# Patient Record
Sex: Male | Born: 1960 | Hispanic: No | Marital: Married | Smoking: Never smoker
Health system: Southern US, Community
[De-identification: ages and names within clinical notes are randomized; demographics above are authoritative.]

## PROBLEM LIST (undated history)

## (undated) DIAGNOSIS — E119 Type 2 diabetes mellitus without complications: Secondary | ICD-10-CM

## (undated) DIAGNOSIS — I10 Essential (primary) hypertension: Secondary | ICD-10-CM

## (undated) DIAGNOSIS — E785 Hyperlipidemia, unspecified: Secondary | ICD-10-CM

## (undated) DIAGNOSIS — N189 Chronic kidney disease, unspecified: Secondary | ICD-10-CM

## (undated) DIAGNOSIS — I639 Cerebral infarction, unspecified: Secondary | ICD-10-CM

## (undated) HISTORY — PX: AV FISTULA PLACEMENT: SHX1204

## (undated) HISTORY — PX: CHOLECYSTECTOMY: SHX55

---

## 1999-03-08 ENCOUNTER — Encounter: Admission: RE | Admit: 1999-03-08 | Discharge: 1999-04-05 | Payer: Self-pay | Admitting: Family Medicine

## 2000-12-21 ENCOUNTER — Encounter: Payer: Self-pay | Admitting: Vascular Surgery

## 2000-12-21 ENCOUNTER — Ambulatory Visit (HOSPITAL_COMMUNITY): Admission: RE | Admit: 2000-12-21 | Discharge: 2000-12-21 | Payer: Self-pay | Admitting: Vascular Surgery

## 2001-12-12 ENCOUNTER — Ambulatory Visit (HOSPITAL_COMMUNITY): Admission: RE | Admit: 2001-12-12 | Discharge: 2001-12-12 | Payer: Self-pay | Admitting: Vascular Surgery

## 2001-12-12 ENCOUNTER — Encounter: Payer: Self-pay | Admitting: Vascular Surgery

## 2001-12-20 ENCOUNTER — Encounter: Payer: Self-pay | Admitting: Vascular Surgery

## 2001-12-20 ENCOUNTER — Ambulatory Visit (HOSPITAL_COMMUNITY): Admission: RE | Admit: 2001-12-20 | Discharge: 2001-12-20 | Payer: Self-pay | Admitting: Vascular Surgery

## 2002-04-10 ENCOUNTER — Ambulatory Visit (HOSPITAL_COMMUNITY): Admission: RE | Admit: 2002-04-10 | Discharge: 2002-04-10 | Payer: Self-pay | Admitting: Nephrology

## 2002-04-10 ENCOUNTER — Encounter: Payer: Self-pay | Admitting: Nephrology

## 2002-05-29 ENCOUNTER — Ambulatory Visit (HOSPITAL_COMMUNITY): Admission: RE | Admit: 2002-05-29 | Discharge: 2002-05-29 | Payer: Self-pay | Admitting: Vascular Surgery

## 2002-05-29 ENCOUNTER — Encounter: Payer: Self-pay | Admitting: Vascular Surgery

## 2007-01-24 ENCOUNTER — Ambulatory Visit (HOSPITAL_COMMUNITY): Admission: RE | Admit: 2007-01-24 | Discharge: 2007-01-24 | Payer: Self-pay | Admitting: Nephrology

## 2007-04-23 ENCOUNTER — Ambulatory Visit (HOSPITAL_COMMUNITY): Admission: RE | Admit: 2007-04-23 | Discharge: 2007-04-23 | Payer: Self-pay | Admitting: Nephrology

## 2007-06-18 ENCOUNTER — Ambulatory Visit (HOSPITAL_COMMUNITY): Admission: RE | Admit: 2007-06-18 | Discharge: 2007-06-18 | Payer: Self-pay | Admitting: Nephrology

## 2007-07-04 ENCOUNTER — Encounter: Payer: Self-pay | Admitting: Infectious Diseases

## 2007-07-08 ENCOUNTER — Encounter: Payer: Self-pay | Admitting: Infectious Diseases

## 2007-10-01 ENCOUNTER — Ambulatory Visit: Payer: Self-pay | Admitting: Infectious Diseases

## 2007-10-01 DIAGNOSIS — N186 End stage renal disease: Secondary | ICD-10-CM | POA: Insufficient documentation

## 2007-10-01 DIAGNOSIS — E119 Type 2 diabetes mellitus without complications: Secondary | ICD-10-CM

## 2007-10-01 DIAGNOSIS — I10 Essential (primary) hypertension: Secondary | ICD-10-CM | POA: Insufficient documentation

## 2007-10-01 LAB — CONVERTED CEMR LAB
AST: 17 units/L (ref 0–37)
Albumin: 4.2 g/dL (ref 3.5–5.2)
BUN: 63 mg/dL — ABNORMAL HIGH (ref 6–23)
Glucose, Bld: 146 mg/dL — ABNORMAL HIGH (ref 70–99)
Hemoglobin: 12 g/dL — ABNORMAL LOW (ref 13.0–17.0)
Potassium: 5.9 meq/L — ABNORMAL HIGH (ref 3.5–5.3)
RBC: 5.51 M/uL (ref 4.22–5.81)
RDW: 17.6 % — ABNORMAL HIGH (ref 11.5–15.5)
Total Bilirubin: 0.6 mg/dL (ref 0.3–1.2)
Total Protein: 7.3 g/dL (ref 6.0–8.3)
WBC: 6.7 10*3/uL (ref 4.0–10.5)

## 2009-06-02 IMAGING — XA IR VENTRICULOPERITONEALSHUNT EVAL/INJ
1 series · 13 of 24 positions shown · non-contrast
Comparison: none

CLINICAL DATA: Poor access flow.
 LEFT ARM AV FISTULOGRAM AND VENOUS ANGIOPLASTY ? 06/18/07 ? 4800 HOURS:
 AV Shuntogram:
 Procedure:  The left arm was prepped and draped in sterile fashion.  An 18-gauge Angiocath was inserted into the outflow vein.  Contrast was injected without complication.

[Series 1: run · 13 of 29 slices shown]
[im 1/29]
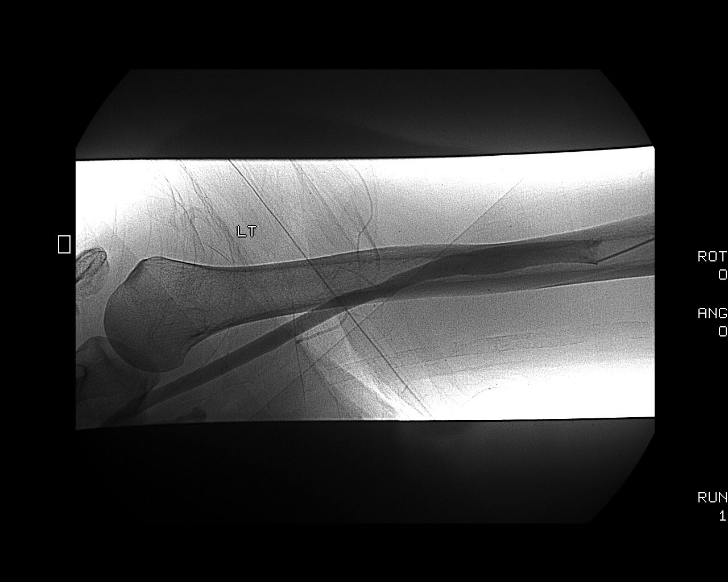
[im 3/29]
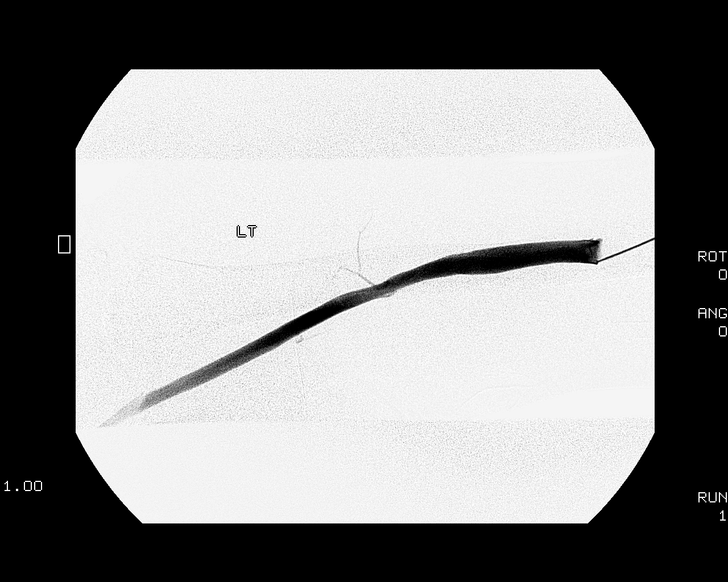
[im 5/29]
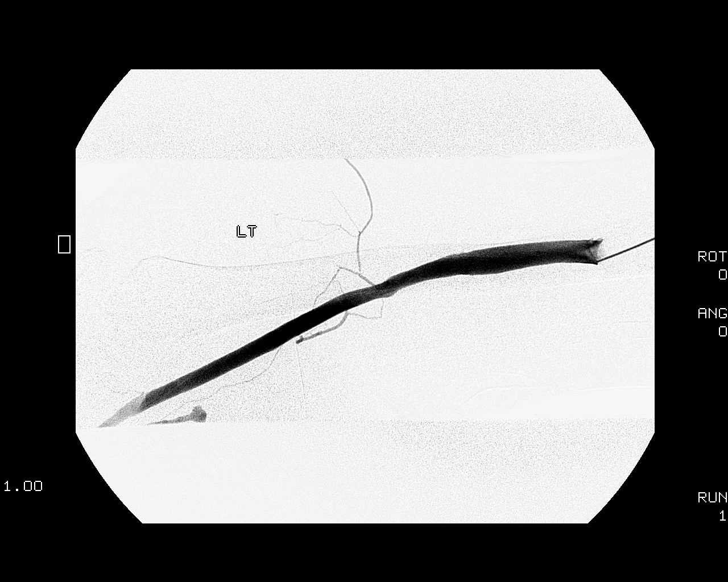
[im 8/29]
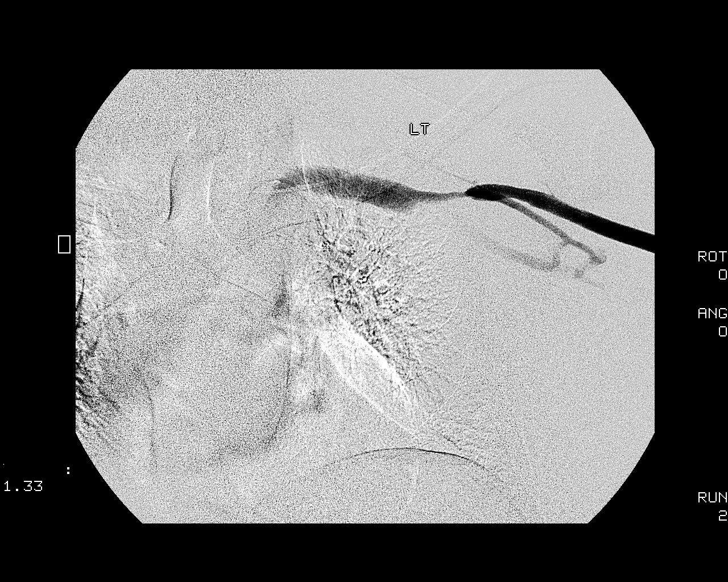
[im 10/29]
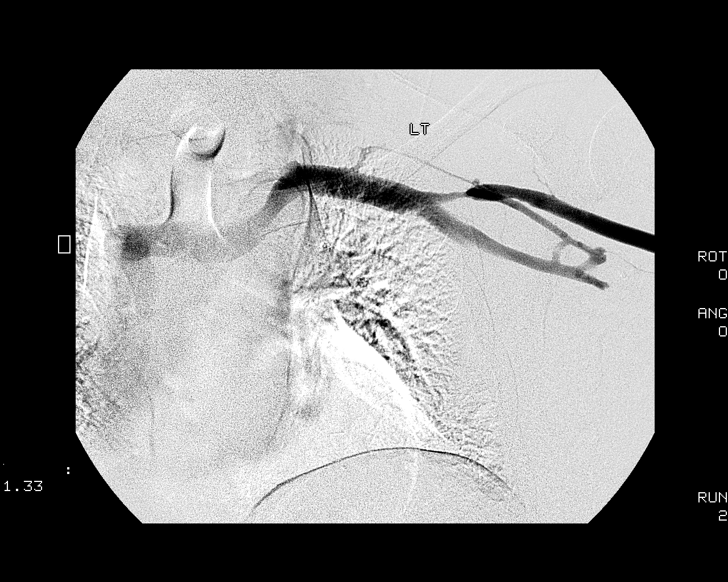
[im 13/29]
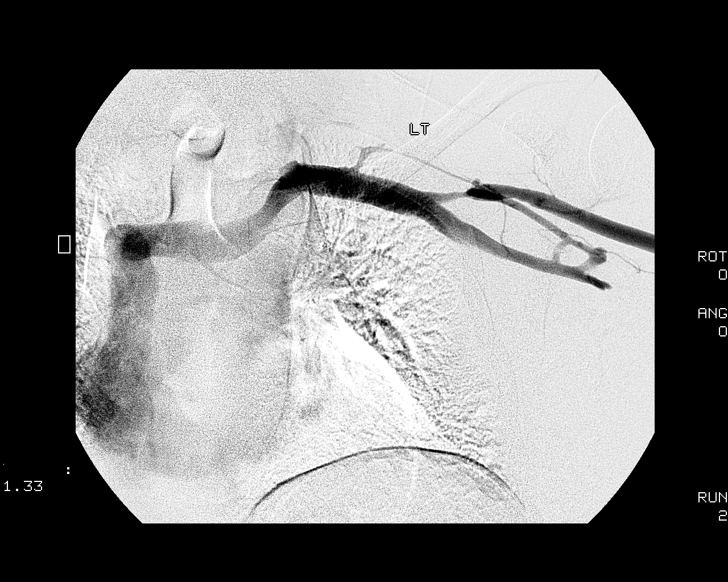
[im 15/29]
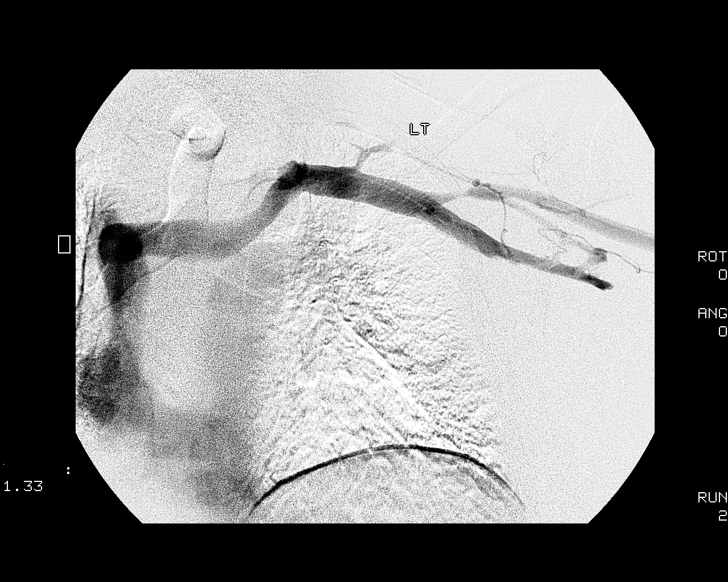
[im 16/29]
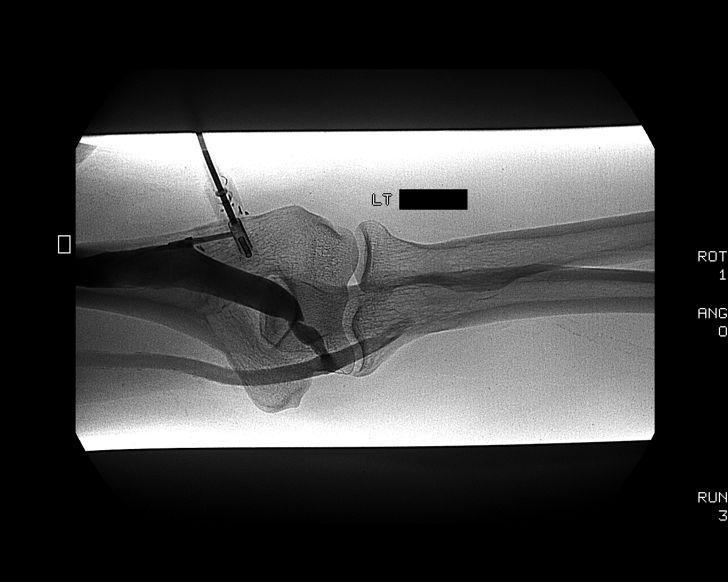
[im 19/29]
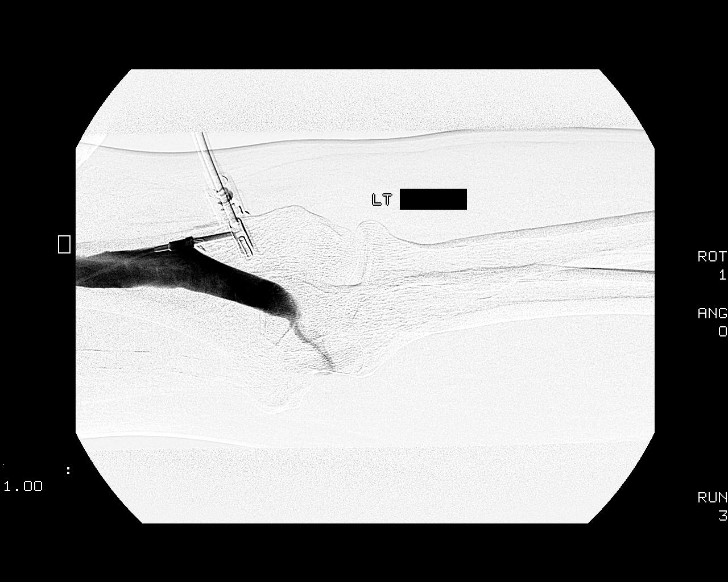
[im 21/29]
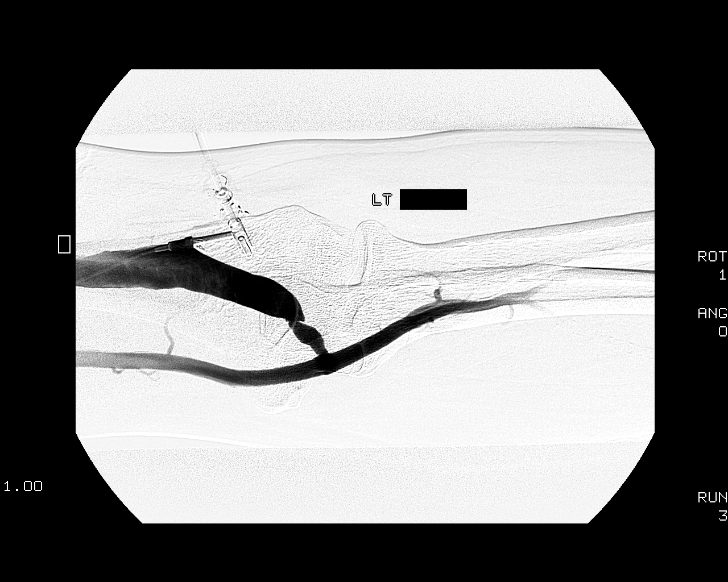
[im 24/29]
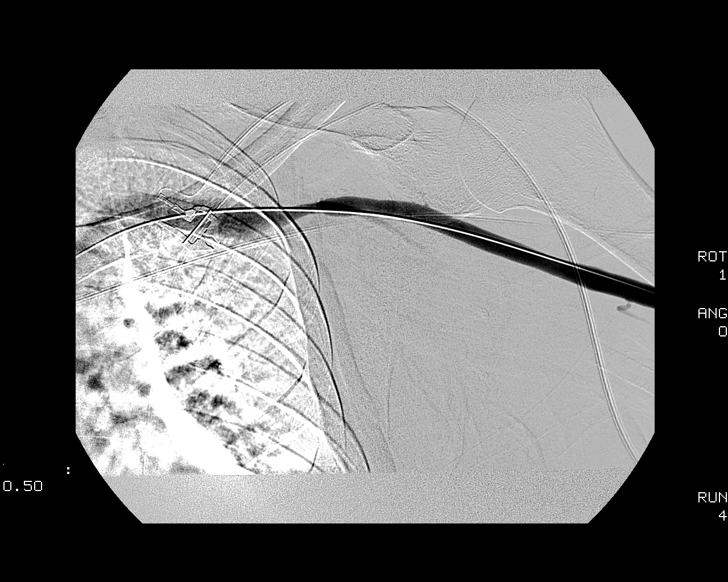
[im 26/29]
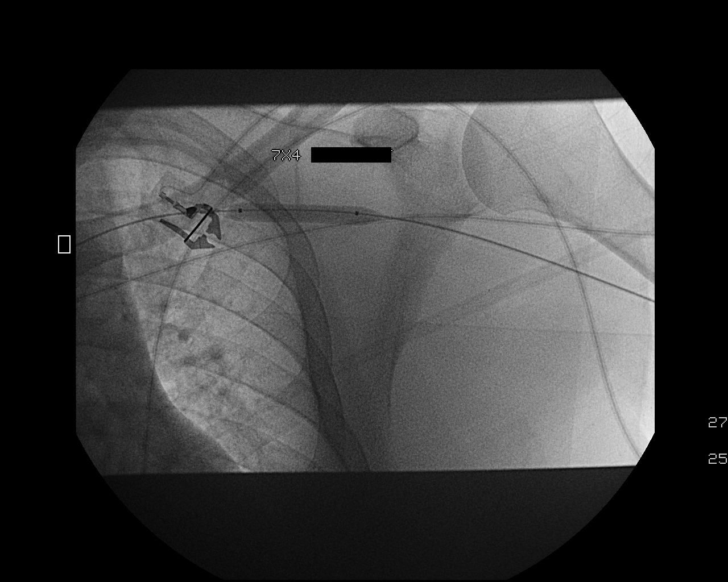
[im 29/29]
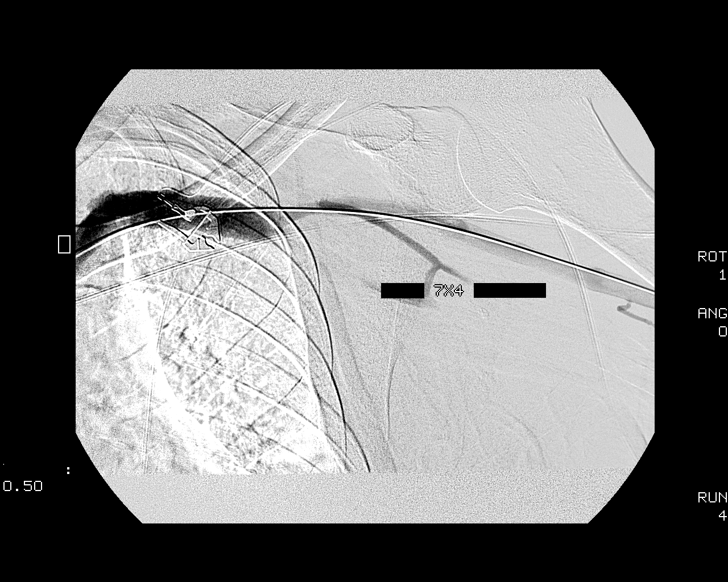

[13 of 24 positions shown; findings below may reference images not displayed]

FINDINGS: There is some irregularity of the arteriovenous anastomosis but no significant stenosis.  There is also a significant stenosis at the cephalosubclavian junction.  Central venous structures are patent.
IMPRESSION: Cephalosubclavian junction stenosis.
 Venous Angioplasty:  
 Procedure:  the Angiocath was exchanged over Danii Aujla for a 6 French sheath.  The cephalosubclavian junction was dilated to 7 mm.  Repeat imaging demonstrates patency.  The sheath was removed and hemostasis was achieved with an 0 Prolene figure 8 stitch.  No complications.
FINDINGS: Post angioplasty imaging demonstrates patency of the cephalosubclavian junction.
IMPRESSION: Successful dilatation of the cephalosubclavian junction to 7 mm.

## 2009-06-29 ENCOUNTER — Ambulatory Visit (HOSPITAL_COMMUNITY): Admission: RE | Admit: 2009-06-29 | Discharge: 2009-06-29 | Payer: Self-pay | Admitting: Nephrology

## 2009-11-08 ENCOUNTER — Ambulatory Visit (HOSPITAL_COMMUNITY): Admission: RE | Admit: 2009-11-08 | Discharge: 2009-11-08 | Payer: Self-pay | Admitting: Nephrology

## 2009-12-30 ENCOUNTER — Ambulatory Visit (HOSPITAL_COMMUNITY): Admission: RE | Admit: 2009-12-30 | Discharge: 2009-12-30 | Payer: Self-pay | Admitting: Nephrology

## 2010-01-13 ENCOUNTER — Ambulatory Visit (HOSPITAL_COMMUNITY): Admission: RE | Admit: 2010-01-13 | Discharge: 2010-01-13 | Payer: Self-pay | Admitting: Nephrology

## 2010-01-21 ENCOUNTER — Ambulatory Visit: Payer: Self-pay | Admitting: Vascular Surgery

## 2010-01-26 ENCOUNTER — Ambulatory Visit (HOSPITAL_COMMUNITY): Admission: RE | Admit: 2010-01-26 | Discharge: 2010-01-26 | Payer: Self-pay | Admitting: Vascular Surgery

## 2010-01-26 ENCOUNTER — Ambulatory Visit: Payer: Self-pay | Admitting: Vascular Surgery

## 2010-05-17 ENCOUNTER — Ambulatory Visit (HOSPITAL_COMMUNITY)
Admission: RE | Admit: 2010-05-17 | Discharge: 2010-05-17 | Payer: Self-pay | Source: Home / Self Care | Attending: Surgery | Admitting: Surgery

## 2010-08-01 LAB — POCT I-STAT, CHEM 8
Calcium, Ion: 1.22 mmol/L (ref 1.12–1.32)
Glucose, Bld: 176 mg/dL — ABNORMAL HIGH (ref 70–99)
HCT: 34 % — ABNORMAL LOW (ref 39.0–52.0)
TCO2: 27 mmol/L (ref 0–100)

## 2010-08-04 LAB — GLUCOSE, CAPILLARY: Glucose-Capillary: 118 mg/dL — ABNORMAL HIGH (ref 70–99)

## 2010-08-04 LAB — POCT I-STAT 4, (NA,K, GLUC, HGB,HCT)
HCT: 41 % (ref 39.0–52.0)
Hemoglobin: 13.9 g/dL (ref 13.0–17.0)

## 2010-08-05 ENCOUNTER — Other Ambulatory Visit (HOSPITAL_COMMUNITY): Payer: Self-pay | Admitting: Nephrology

## 2010-08-05 DIAGNOSIS — N186 End stage renal disease: Secondary | ICD-10-CM

## 2010-08-09 ENCOUNTER — Ambulatory Visit (HOSPITAL_COMMUNITY)
Admission: RE | Admit: 2010-08-09 | Discharge: 2010-08-09 | Disposition: A | Discharge status: Home / Self Care | Payer: Self-pay | Source: Ambulatory Visit | Attending: Nephrology | Admitting: Nephrology

## 2010-08-09 ENCOUNTER — Encounter (HOSPITAL_COMMUNITY): Payer: Self-pay

## 2010-08-09 DIAGNOSIS — N186 End stage renal disease: Secondary | ICD-10-CM | POA: Insufficient documentation

## 2010-08-09 DIAGNOSIS — Z794 Long term (current) use of insulin: Secondary | ICD-10-CM | POA: Insufficient documentation

## 2010-09-06 ENCOUNTER — Ambulatory Visit (HOSPITAL_COMMUNITY): Admission: RE | Admit: 2010-09-06 | Payer: Self-pay | Source: Ambulatory Visit | Admitting: Surgery

## 2010-10-04 NOTE — Assessment & Plan Note (Signed)
OFFICE VISIT   CINCERE, ZORN  DOB:  [birth year]                                       01/21/2010  ZOXWR#:[id number]   This is a new vascular patient consultation.   CHIEF COMPLAINT:  Failing left arterial venous fistula.   HISTORY OF PRESENT ILLNESS:  This is a 50 year old gentleman who  previously has had a previous left arteriovenous graft in the forearm  position and a left brachiocephalic arteriovenous fistula (2004)which  has been functional until recently.  He required a fistulogram that was  performed by the interventionalist on January 13, 2010.  This  demonstrated adherent clot in the humerus region and was felt that  further endovascular intervention on this fistula was going to be  unsuccessful.  The patient was referred back to [place] for evaluation for  possible graft placement.  At this point he notes no steal symptoms in  the left hand.  No motor deficits.  No sensory deficits.  Never  developed any pseudoaneurysmal changes in his arteriovenous fistula and  no bleeding complications from that.  He is right-hand dominant and  previously has had a right internal jugular tunneled dialysis catheter  placed.   PAST MEDICAL HISTORY:  #1.  End-stage renal disease requiring  hemodialysis.  #2. Secondary hyperparathyroidism.  #3. Hepatitis C.   PAST SURGICAL HISTORY:  #1.  Previous left forearm arteriovenous graft.  #2. Previous left brachiocephalic arteriovenous fistula with placement  of a right internal jugular tunneled dialysis catheter.   SOCIAL HISTORY:  He is married.  Currently intermittently employed.  He  does not currently smoke, but has a previous smoking history, but even  then minimal cigarettes per day.  He drinks socially, but the denies any  illicit drug use.   FAMILY HISTORY:  Both mother and father had diabetes and hypertension.   REVIEW OF SYSTEMS:  He notes a change in his eyesight, but otherwise the  rest of his 12-point  review of systems as documented in his chart is  noted to be negative.   PHYSICAL EXAM:  VITAL SIGNS:  Temperature was 97.8, blood pressure  170/90, heart rate of 69, respirations of 12.  GENERAL:  Well developed, well nourished in no apparent distress.  HEAD:  Normocephalic, atraumatic.  ENT:  Hearing is grossly intact.  There is no obvious nasal drainage.  The oropharynx had no erythema or exudate.  NECK:  Supple without nuchal rigidity.  EYES:  Pupils equal, round, react to light.  Extraocular movements were  intact.  PULMONARY:  Symmetric expansion.  Good air movement.  Clear to  auscultation bilaterally.  No rhonchi, rales or wheezes were noted.  CARDIAC:  Regular rate and rhythm.  Normal S1-S2.  There were no  murmurs, rubs, thrills or gallops.  On his pulse examination; bilateral  radial pulses were easily palpable as were the brachial.  There is no  pulse throughout the forearm graft I did appreciate a pulsatile  brachiocephalic arteriovenous fistula with a high pitch bruit.  His  aorta was not palpable.  Bilateral femoral, popliteal and pedal pulses  were palpable.  GI:  Soft, somewhat obese, nontender, nondistended, no guarding or  rebound.  No masses or hepatosplenomegaly.  I could not feel this  patient's aorta due to his obesity.  No costovertebral angle tenderness.  MUSCULOSKELETAL:  He had intact strength in  bilateral upper and lower  extremities with 5/5 hand grip in both hands.  Intrinsic and extrinsic  muscles in the hands were intact.  SKIN:  On his left arm he had multiple healed incisions consistent with  the previously noted procedures.  There is no ecchymoses or any  pseudoaneurysmal changes in the left brachiocephalic fistula.  Neither  lower extremities demonstrated any gangrenous changes or any ulcerations  or erythema.  NEUROLOGY:  Cranial nerves II-XII were intact.  MUSCULOSKELETAL:  As noted above.  SENSORY:  Bilateral upper and lower extremities were  intact grossly.  Specifically pinpoint and light touch were present in both hands.  PSYCH:  Judgment was intact.  Mood and affect were appropriate for his  situation.  LYMPHATIC:  There is no cervical, inguinal or axillary lymphadenopathy.   The patient had bilateral upper extremity vein mapping which I  interpreted.  On the right side he has a cephalic vein that ranges from  0.42 up to 0.54 from the upper portion of the arm down to the wrist, at  least four large side branches noted.  The basilic vein on the right  side is noted in the range between 0.28-0.52 cm.  On the left side the  cephalic was noted to be in use with an arteriovenous fistula.  The left  basilic ranged between a 0.4 to 0.8 cm with a median antecubital noted  to be 0.64.   MEDICAL DECISION MAKING:  This is a 50 year old gentleman who now has a  failing left brachiocephalic arteriovenous fistula as documented by his  fistulogram.  It appears that he will occlude the outflow portion of the  cephalic vein in the imminent future.  This is further emphasized by the  fact that he is only achieving flow rates of 300 on dialysis.  Due to  the location of this stenotic segment I think thatan endovascular  intervention would probably require stent placement.  However, the  basilic vein at the segment appears widely patent as it joins into the  axillary, so placement of a stent at this location may compromise a left  sided basilic vein transposition.  So I do not recommend this at this  time.  He has an excellent right radiocephalic fistula potential.  I  gave him his options including the Bescia-Cimino fistula, a Kaufman  fistula on the right side and the possibility of right or left basilic  vein transposition.  I explained to him the various steal possibilities  and he elected to proceed forward with the right radiocephalic  arteriovenous fistula which tentatively I will schedule for this coming  Wednesday.  At that time  due to the imminent failure of his left  brachiocephalic fistula I would go ahead and place a left internal  jugular tunneled dialysis catheter to allow continued dialysis while he  matures this fistula.     Leonides Sake, MD  Electronically Signed   BC/MEDQ  D:  01/21/2010  T:  01/25/2010  Job:  [id number]

## 2010-10-04 NOTE — Procedures (Signed)
CEPHALIC VEIN MAPPING   INDICATION:  Preop for AV fistula placement.   HISTORY:  End-stage renal disease, current left brachiocephalic AV fistula.   EXAM:  The right cephalic vein is compressible with diameter measurements  ranging from 0.42 to 0.54 cm.   The right basilic vein is compressible with diameter measurements  ranging from 0.28 to 0.54 cm.   The left cephalic vein is currently in use for a patent AV fistula.   The left basilic vein is compressible with diameter measurements ranging  from 0.4 to 0.8 cm.   See attached worksheet for all measurements.   IMPRESSION:  1. Patent right cephalic and bilateral basilic veins with diameter      measurements as described above.  2. Patent left brachiocephalic arteriovenous fistula noted.   ___________________________________________  Leonides Sake, MD   CH/MEDQ  D:  01/21/2010  T:  01/21/2010  Job:  [id number]

## 2010-10-07 NOTE — Op Note (Signed)
Theodore Hurley, Theodore Hurley                          ACCOUNT NO.:  [id number]   MEDICAL RECORD NO.:  [id number]                   PATIENT TYPE:  OIB   LOCATION:  2853                                 FACILITY:  [facility]   PHYSICIAN:  Di Kindle. Edilia Bo, M.D.        DATE OF BIRTH:  Jul 04, 1960   DATE OF PROCEDURE:  05/29/2002  DATE OF DISCHARGE:                                 OPERATIVE REPORT   PREOPERATIVE DIAGNOSIS:  Chronic renal failure.   POSTOPERATIVE DIAGNOSIS:  Chronic renal failure.   OPERATION:  1. Exploration of left forearm AV fistula.  2. Placement of new left upper AV fistula.  3. Ultrasound of both internal jugular veins.  4. Right IJ Diatek catheter (28 cm).   SURGEON:  Di Kindle. Edilia Bo, M.D.   ASSISTANT:  Eber Hong, P.A.   ANESTHESIA:  Local with sedation.   DESCRIPTION OF PROCEDURE:  The patient was taken to the operating room and  sedated by anesthesia.  The entire left upper extremity was prepped and  draped in the usual sterile fashion.  After the skin was infiltrated with 1%  Lidocaine an oblique was made over the cephalic vein above the previous  revision and through this incision, the vein was dissected free and was a  good size vein.  I dissected free the radial artery proximally to the  previous revision; however, the artery here was very small and I did not  think this would support a fistula or improve the function of this fistula,  therefore, I could not revise the current fistula.  It was then ligated with  2-0 silk tie and the wound was closed with interrupted 3-0 Vicryl and the  skin closed with 4-0 Vicryl.   Next, a separate transverse incision was made above the antecubital space,  where here the upper arm cephalic vein was dissected free and was good size.  Beneath the fascia, the brachial artery was dissected free.  The patient was  then heparinized.  The brachial artery was clamped proximally and distally  and a longitudinal  arteriotomy was made.  The vein was mobilized over after  it was ligated distally and it was sewn end-to-side to the artery using  continuous 6-0 Prolene suture.  At the completion, there was a good thrill  in the fistula.  Hemostasis was obtained in the wound.  The wound was closed  with a deep layer of 3-0 Vicryl.  The skin was closed with 4-0 Vicryl.  Of  note, the heparin had been reversed with Protamine.  Sterile dressing was  applied.   Next, the patient was repositioned and both internal jugular veins were  identified with the ultrasound scanner and marked.  Next, the neck and upper  chest were prepped and draped in the usual sterile fashion. After the skin  was infiltrated with 1% Lidocaine, the right internal jugular vein was  cannulated and a guide wire introduced into the superior  vena cava under  fluoroscopic control.  The track over the wire was then dilated and then the  dilator and peel away sheath were passed over the wire and the wire and  dilator removed.  The catheter was positioned in the right atrium.  The exit  site for the catheter was selected and the skin anesthetized between the two  areas.  The catheter was then brought through the tunnel and secured with a  3-0 Nylon distally.  Next, the IJ cannulation site was closed with  a 4-0 subcuticular stitch.  Both ports withdrew easily and were then flushed  with heparinized saline and filled with concentrated heparin.  Sterile  dressing was applied.  The patient tolerated the procedure well and was  transferred to the recovery room in satisfactory condition.  All needle and  sponge counts were correct.                                               Di Kindle. Edilia Bo, M.D.    CSD/MEDQ  D:  05/29/2002  T:  05/29/2002  Job:  [id number]

## 2010-10-07 NOTE — Op Note (Signed)
   [name], Theodore Hurley                          ACCOUNT NO.:  [id number]   MEDICAL RECORD NO.:  [id number]                   PATIENT TYPE:  OIB   LOCATION:  2899                                 FACILITY:  [facility]   PHYSICIAN:  Quita Skye. Hart Rochester, M.D.               DATE OF BIRTH:  [birth year]   DATE OF PROCEDURE:  12/20/2001  DATE OF DISCHARGE:  12/20/2001                                 OPERATIVE REPORT   PREOPERATIVE DIAGNOSIS:  Poorly functioning AV fistula, left forearm.   POSTOPERATIVE DIAGNOSIS:  Poorly functioning AV fistula, left forearm  secondary to focal stenosis and arterial anastomosis.   OPERATION:  Dacron patch angioplasty of stenotic segment of intimal  hyperplasia in AV fistula, left forearm.   SURGEON:  Quita Skye. Hart Rochester, M.D.   FIRST ASSISTANT:  Loura Pardon.   ANESTHESIA:  Local.   PROCEDURE:  The patient was taken to the operating room and placed in the  supine position.  The left upper extremity was prepped with Betadine and  scrubbing solution and draped in routine sterile manner.  After infiltration  with 1% Xylocaine with Epinephrine, a longitudinal incision was made through  the previous scar at the wrist where the cephalic vein had been anastomosed  to the radial artery.  There was an excellent pulse over the hood of the  anastomosis and then a strictured area about 4 cm in length.  The vein  proximal to this had diminished pulse.  3,000 units of Heparin were given  intravenously.  A longitudinal opening was made through the area of  narrowing onto the hood of the anastomosis and also proximally where it  would accept a 4.5 mm dilator.  There was intimal hyperplasia in this  segment and it was removed locally as much as possible with the elevator.  Dacron patch was then sewn into place with 6-0 Prolene.  The vessel loops  were released and there was an improved pulse and thrill in the fistula.  No  Protamine was given.  The wound was irrigated with saline,  closed with  Vicryl in subcuticular fashion, and sterile dressing applied.  The patient  was taken to the recovery room in satisfactory condition.                                                Quita Skye Hart Rochester, M.D.    JDL/MEDQ  D:  12/20/2001  T:  12/25/2001  Job:  [id number]

## 2010-10-28 ENCOUNTER — Other Ambulatory Visit (HOSPITAL_COMMUNITY): Payer: Self-pay | Admitting: Nephrology

## 2010-10-28 DIAGNOSIS — N186 End stage renal disease: Secondary | ICD-10-CM

## 2010-11-01 ENCOUNTER — Ambulatory Visit (HOSPITAL_COMMUNITY): Admission: RE | Admit: 2010-11-01 | Payer: Self-pay | Source: Ambulatory Visit | Admitting: Nephrology

## 2010-11-03 ENCOUNTER — Other Ambulatory Visit (HOSPITAL_COMMUNITY): Payer: Self-pay | Admitting: Nephrology

## 2010-11-03 DIAGNOSIS — I878 Other specified disorders of veins: Secondary | ICD-10-CM

## 2010-11-07 ENCOUNTER — Other Ambulatory Visit (HOSPITAL_COMMUNITY): Payer: Self-pay | Admitting: Nephrology

## 2010-11-07 DIAGNOSIS — I878 Other specified disorders of veins: Secondary | ICD-10-CM

## 2010-11-08 ENCOUNTER — Inpatient Hospital Stay (HOSPITAL_COMMUNITY): Admission: RE | Admit: 2010-11-08 | Payer: Self-pay | Source: Ambulatory Visit

## 2010-11-24 ENCOUNTER — Ambulatory Visit (HOSPITAL_COMMUNITY)
Admission: RE | Admit: 2010-11-24 | Discharge: 2010-11-24 | Disposition: A | Discharge status: Home / Self Care | Payer: Self-pay | Source: Ambulatory Visit | Attending: Nephrology | Admitting: Nephrology

## 2010-11-24 DIAGNOSIS — Y832 Surgical operation with anastomosis, bypass or graft as the cause of abnormal reaction of the patient, or of later complication, without mention of misadventure at the time of the procedure: Secondary | ICD-10-CM | POA: Insufficient documentation

## 2010-11-24 DIAGNOSIS — I878 Other specified disorders of veins: Secondary | ICD-10-CM

## 2010-11-24 DIAGNOSIS — N186 End stage renal disease: Secondary | ICD-10-CM | POA: Insufficient documentation

## 2010-11-24 DIAGNOSIS — T82898A Other specified complication of vascular prosthetic devices, implants and grafts, initial encounter: Secondary | ICD-10-CM | POA: Insufficient documentation

## 2010-11-24 MED ORDER — IOHEXOL 300 MG/ML  SOLN
100.0000 mL | Freq: Once | INTRAMUSCULAR | Status: AC | PRN
  Administered 2010-11-24: 40 mL via INTRAVENOUS

## 2010-12-07 ENCOUNTER — Other Ambulatory Visit (HOSPITAL_COMMUNITY): Payer: Self-pay | Admitting: Nephrology

## 2010-12-07 DIAGNOSIS — N186 End stage renal disease: Secondary | ICD-10-CM

## 2010-12-20 ENCOUNTER — Ambulatory Visit (HOSPITAL_COMMUNITY): Payer: Self-pay

## 2011-04-03 ENCOUNTER — Other Ambulatory Visit (HOSPITAL_COMMUNITY): Payer: Self-pay | Admitting: Nephrology

## 2011-04-03 DIAGNOSIS — N186 End stage renal disease: Secondary | ICD-10-CM

## 2011-04-26 ENCOUNTER — Other Ambulatory Visit (HOSPITAL_COMMUNITY): Payer: Self-pay | Admitting: Nephrology

## 2011-04-26 DIAGNOSIS — N186 End stage renal disease: Secondary | ICD-10-CM

## 2011-04-27 ENCOUNTER — Ambulatory Visit (HOSPITAL_COMMUNITY): Admission: RE | Admit: 2011-04-27 | Payer: Self-pay | Source: Ambulatory Visit

## 2011-04-27 ENCOUNTER — Ambulatory Visit (HOSPITAL_COMMUNITY): Payer: Self-pay

## 2011-04-28 ENCOUNTER — Other Ambulatory Visit (HOSPITAL_COMMUNITY): Payer: Self-pay | Admitting: Nephrology

## 2011-04-28 DIAGNOSIS — N186 End stage renal disease: Secondary | ICD-10-CM

## 2011-05-11 ENCOUNTER — Ambulatory Visit (HOSPITAL_COMMUNITY): Admission: RE | Admit: 2011-05-11 | Payer: Self-pay | Source: Ambulatory Visit

## 2011-05-30 ENCOUNTER — Inpatient Hospital Stay (HOSPITAL_COMMUNITY): Admission: RE | Admit: 2011-05-30 | Payer: Self-pay | Source: Ambulatory Visit

## 2011-06-20 ENCOUNTER — Ambulatory Visit (HOSPITAL_COMMUNITY)
Admission: RE | Admit: 2011-06-20 | Discharge: 2011-06-20 | Disposition: A | Discharge status: Home / Self Care | Payer: Self-pay | Source: Ambulatory Visit | Attending: Nephrology | Admitting: Nephrology

## 2011-06-20 DIAGNOSIS — N186 End stage renal disease: Secondary | ICD-10-CM

## 2011-06-20 DIAGNOSIS — Z0389 Encounter for observation for other suspected diseases and conditions ruled out: Secondary | ICD-10-CM | POA: Insufficient documentation

## 2011-06-20 MED ORDER — IOHEXOL 300 MG/ML  SOLN
100.0000 mL | Freq: Once | INTRAMUSCULAR | Status: AC | PRN
  Administered 2011-06-20: 32 mL via INTRAVENOUS

## 2011-06-22 ENCOUNTER — Telehealth (HOSPITAL_COMMUNITY): Payer: Self-pay

## 2011-07-21 ENCOUNTER — Other Ambulatory Visit (HOSPITAL_COMMUNITY): Payer: Self-pay | Admitting: Nephrology

## 2011-07-21 DIAGNOSIS — N186 End stage renal disease: Secondary | ICD-10-CM

## 2011-07-27 ENCOUNTER — Ambulatory Visit (HOSPITAL_COMMUNITY): Payer: Self-pay

## 2011-08-01 ENCOUNTER — Ambulatory Visit (HOSPITAL_COMMUNITY)
Admission: RE | Admit: 2011-08-01 | Discharge: 2011-08-01 | Disposition: A | Discharge status: Home / Self Care | Payer: Self-pay | Source: Ambulatory Visit | Attending: Nephrology | Admitting: Nephrology

## 2011-08-01 DIAGNOSIS — Y832 Surgical operation with anastomosis, bypass or graft as the cause of abnormal reaction of the patient, or of later complication, without mention of misadventure at the time of the procedure: Secondary | ICD-10-CM | POA: Insufficient documentation

## 2011-08-01 DIAGNOSIS — N186 End stage renal disease: Secondary | ICD-10-CM

## 2011-08-01 DIAGNOSIS — T82898A Other specified complication of vascular prosthetic devices, implants and grafts, initial encounter: Secondary | ICD-10-CM | POA: Insufficient documentation

## 2011-08-01 MED ORDER — IOHEXOL 300 MG/ML  SOLN
66.0000 mL | Freq: Once | INTRAMUSCULAR | Status: AC | PRN
  Administered 2011-08-01: 66 mL via INTRAVENOUS

## 2011-08-01 NOTE — Procedures (Signed)
Right fistula is patent.  No significant stenosis.  No intervention performed.  See Radiology report.

## 2011-08-02 ENCOUNTER — Telehealth (HOSPITAL_COMMUNITY): Payer: Self-pay | Admitting: *Deleted

## 2011-08-02 NOTE — Telephone Encounter (Signed)
Post procedure follow up call. Pt not available spoke with family member says pt's doing well.

## 2011-12-15 IMAGING — XA IR AV DIALYSIS GRAFT DECLOT
1 series · 12 of 24 positions shown · non-contrast
Comparison: 11/08/2009

CLINICAL DATA: end stage renal disease, thrombosis of left upper
arm hemodialysis fistula

DIALYSIS FISTULA DECLOT
VENOUS ANGIOPLASTY

[Series 1: run · 12 of 64 slices shown]
[im 3/64]
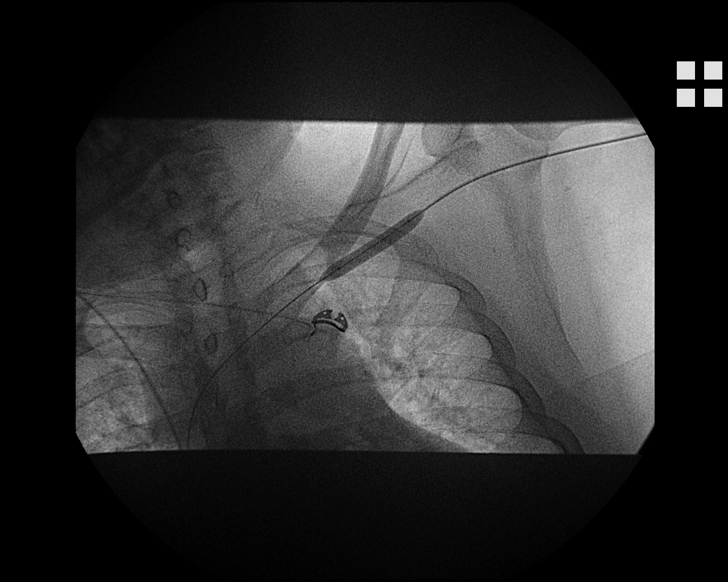
[im 9/64]
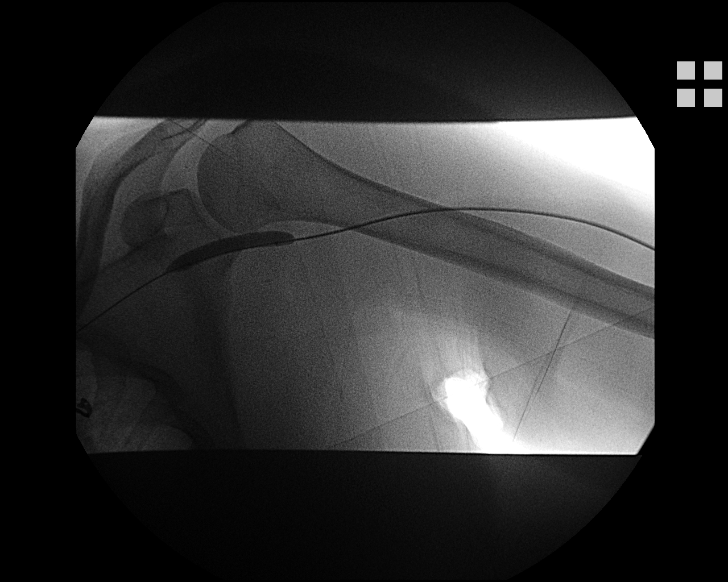
[im 14/64]
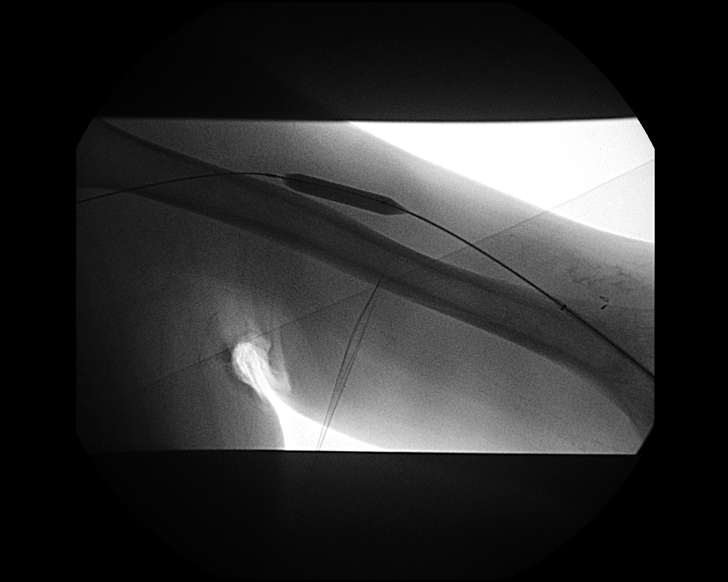
[im 20/64]
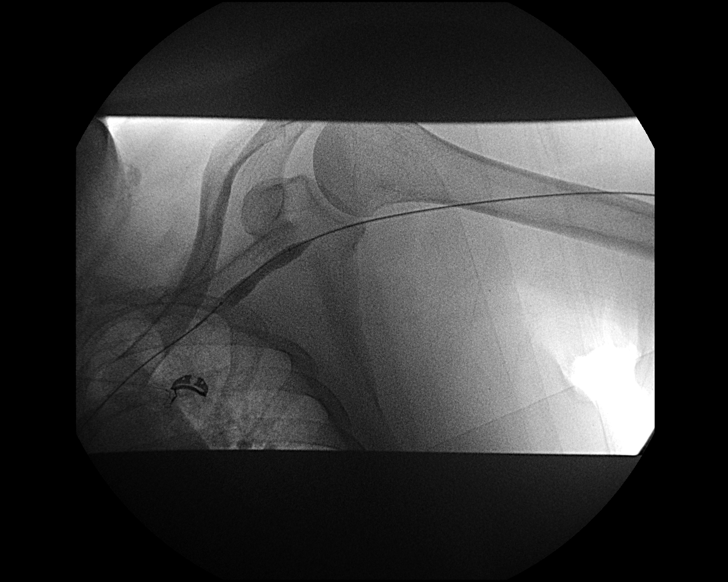
[im 25/64]
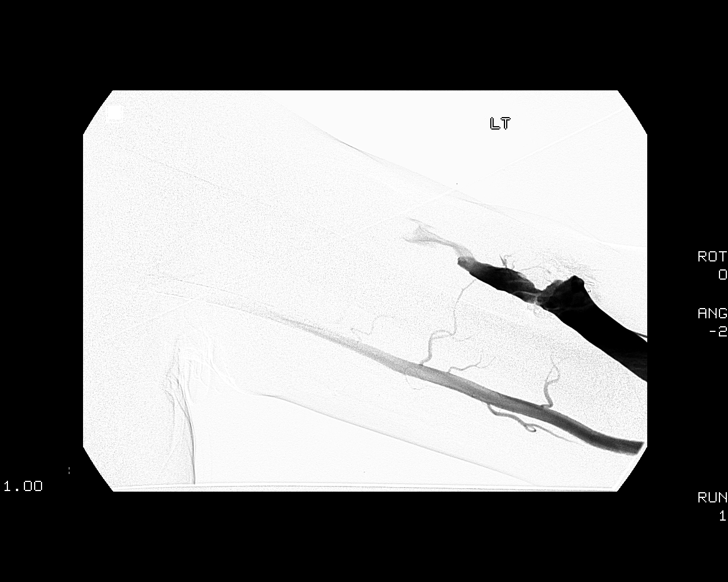
[im 31/64]
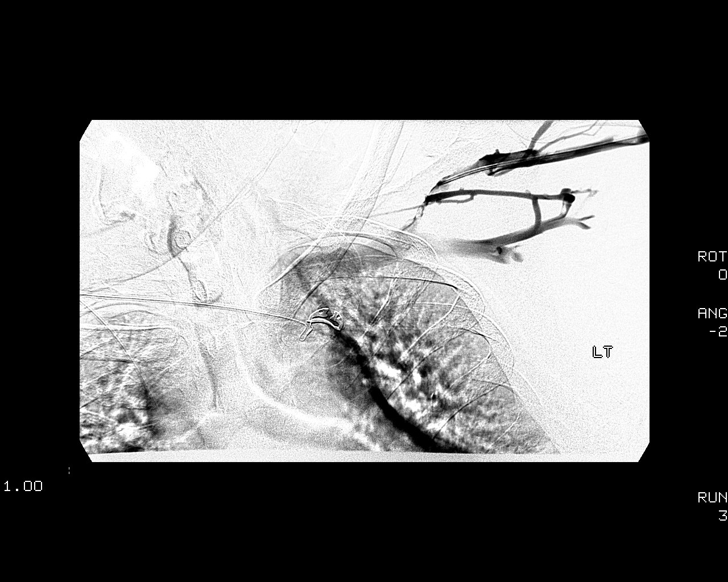
[im 36/64]
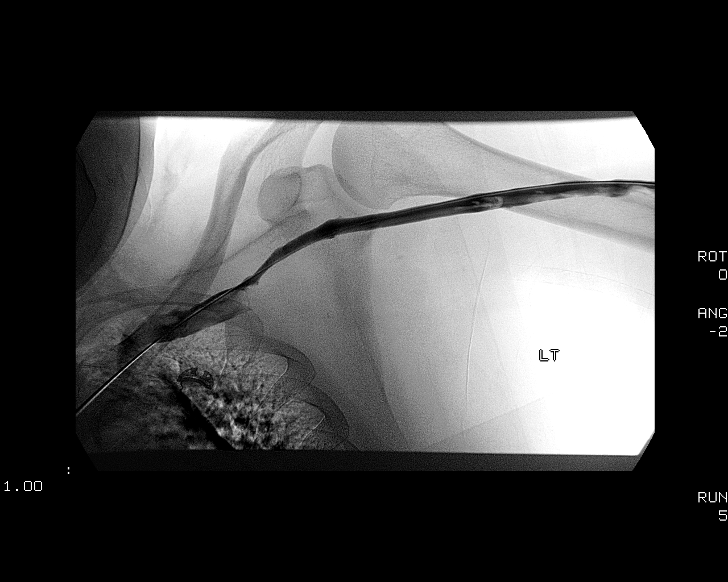
[im 42/64]
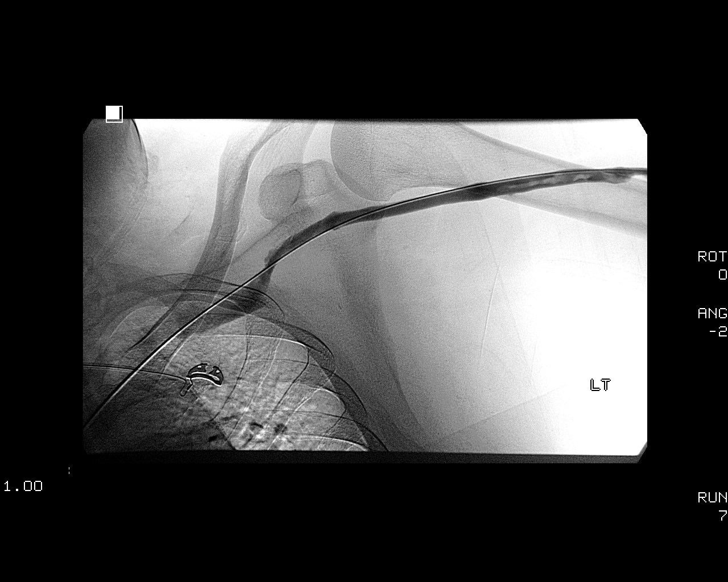
[im 47/64]
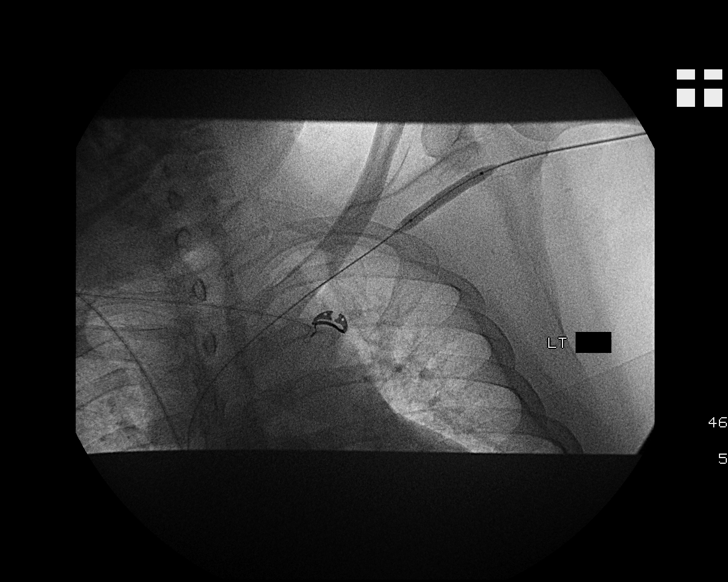
[im 53/64]
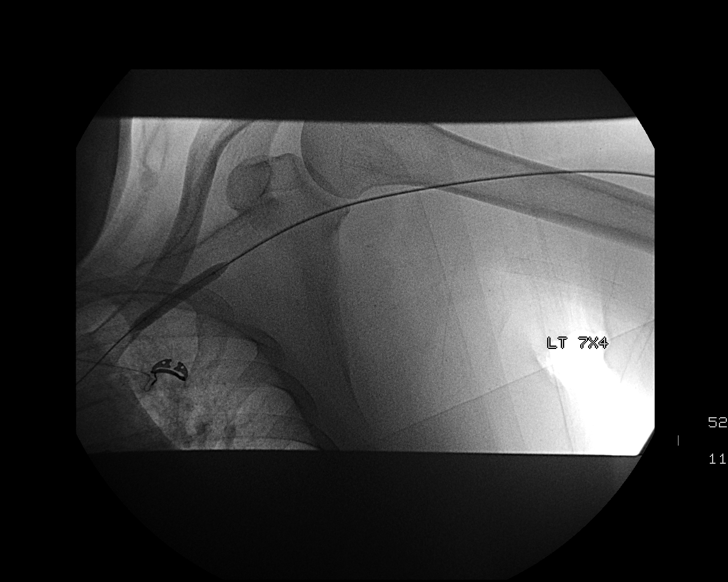
[im 58/64]
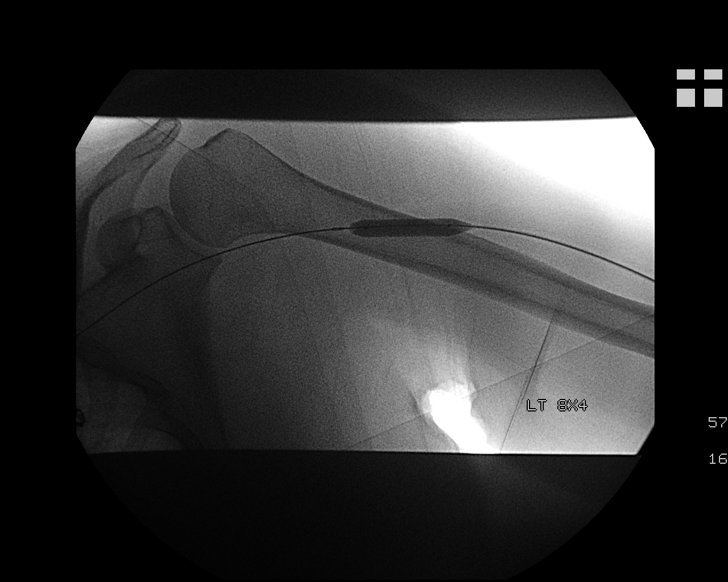
[im 64/64]
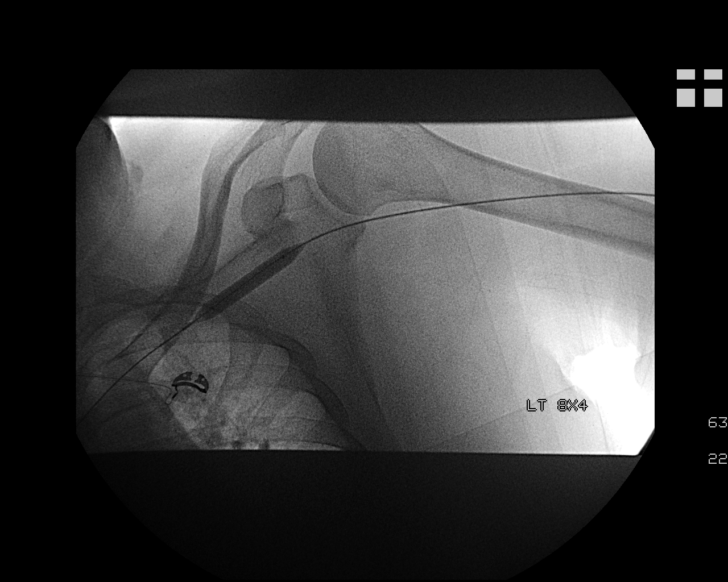

[12 of 24 positions shown; findings below may reference images not displayed]

Technique and findings: An 18-gauge angiocatheter was placed
antegrade into the outflow vein just central to the patient's left
upper arm nativeAV hemodialysis fistula for dialysis fistulography.
Occlusion of the central aspect of the outflow vein was confirmed.
The angiocatheter and surrounding skin were then prepped with
Betadine, draped in usual sterile fashion, infiltrated locally with
1% lidocaine. The angiocatheter was exchanged over a Benson wire
for a 6 French vascular sheath, through which a a 5-French Kumpe
catheter was advanced into the thrombosed outflow vein.  2 mg TPA
were administered.  The Kumpe catheter was advanced centrally for
central venography demonstrating patency of the left subclavian,
innominate, and innominate veins and SVC.  High-grade cephalic
central cephalic vein stenosis was noted. 1888 units heparin were
administered intravenously.

The Kumpe was exchanged for a 7 mm x 4 cm Conquest angioplasty
balloon , advanced to the level of a the central cephalic
veinstenosis for venous angioplasty using 60 second  overlapping
inflations at 30 atmospheres. The balloon was utilized to macerate
thrombus in the outflow vein.  After follow-up venography, further
dilatation with a 8 mm x 4 cm Conquest angioplasty balloon was
performed through the length of the outflow vein. Follow-up
venography showed no residual recurrent stenosis, extravasation,
dissection, or other apparent complication.

 After final outflow venography, the catheter, sheath, and
guidewire were removed and hemostasis achieved with a 2-0 Ethilon
purse-string suture. No immediate complication.

IMPRESSION

1.  Successful declot of left upper arm thrombosed dialysis
fistula.
2.  Technically successful 7 and 8 mm balloon angioplasty of a
central outflow cephalic vein stenosis.

Access management:

## 2012-01-03 ENCOUNTER — Other Ambulatory Visit (HOSPITAL_COMMUNITY): Payer: Self-pay | Admitting: Nephrology

## 2012-01-03 DIAGNOSIS — N186 End stage renal disease: Secondary | ICD-10-CM

## 2012-01-09 ENCOUNTER — Ambulatory Visit (HOSPITAL_COMMUNITY)
Admission: RE | Admit: 2012-01-09 | Discharge: 2012-01-09 | Disposition: A | Discharge status: Home / Self Care | Payer: Self-pay | Source: Ambulatory Visit | Attending: Nephrology | Admitting: Nephrology

## 2012-01-09 DIAGNOSIS — Z0389 Encounter for observation for other suspected diseases and conditions ruled out: Secondary | ICD-10-CM | POA: Insufficient documentation

## 2012-01-09 DIAGNOSIS — N186 End stage renal disease: Secondary | ICD-10-CM

## 2012-01-09 MED ORDER — IOHEXOL 300 MG/ML  SOLN
100.0000 mL | Freq: Once | INTRAMUSCULAR | Status: AC | PRN
  Administered 2012-01-09: 40 mL via INTRAVENOUS

## 2012-01-10 ENCOUNTER — Telehealth (HOSPITAL_COMMUNITY): Payer: Self-pay | Admitting: *Deleted

## 2012-01-10 NOTE — Telephone Encounter (Signed)
Post procedure follow up call.  Attempted to call x 3 with no answer and busy x 2

## 2013-02-11 ENCOUNTER — Other Ambulatory Visit (HOSPITAL_COMMUNITY): Payer: Self-pay | Admitting: Nephrology

## 2013-02-11 DIAGNOSIS — N186 End stage renal disease: Secondary | ICD-10-CM

## 2013-02-13 ENCOUNTER — Ambulatory Visit (HOSPITAL_COMMUNITY): Payer: Self-pay

## 2013-02-20 ENCOUNTER — Ambulatory Visit (HOSPITAL_COMMUNITY)
Admission: RE | Admit: 2013-02-20 | Discharge: 2013-02-20 | Disposition: A | Discharge status: Home / Self Care | Payer: Medicaid Other | Source: Ambulatory Visit | Attending: Nephrology | Admitting: Nephrology

## 2013-02-20 DIAGNOSIS — N186 End stage renal disease: Secondary | ICD-10-CM | POA: Insufficient documentation

## 2013-02-20 MED ORDER — IOHEXOL 300 MG/ML  SOLN
100.0000 mL | Freq: Once | INTRAMUSCULAR | Status: AC | PRN
  Administered 2013-02-20: 40 mL via INTRAVENOUS

## 2013-02-20 NOTE — Procedures (Signed)
Widely patent fistula No comp

## 2014-07-02 ENCOUNTER — Other Ambulatory Visit: Payer: Self-pay

## 2014-07-08 MED ORDER — SODIUM CHLORIDE 0.9 % IJ SOLN: 3.0000 mL | INTRAMUSCULAR | Status: DC | PRN

## 2014-07-10 ENCOUNTER — Encounter: Payer: Self-pay | Admitting: *Deleted

## 2014-07-13 ENCOUNTER — Ambulatory Visit (HOSPITAL_COMMUNITY)
Admission: RE | Admit: 2014-07-13 | Discharge: 2014-07-13 | Disposition: A | Discharge status: Home / Self Care | Payer: Medicaid Other | Source: Ambulatory Visit | Attending: Nephrology | Admitting: Nephrology

## 2014-07-13 ENCOUNTER — Emergency Department (HOSPITAL_COMMUNITY): Payer: Medicaid Other

## 2014-07-13 ENCOUNTER — Other Ambulatory Visit (HOSPITAL_COMMUNITY): Payer: Self-pay | Admitting: Nephrology

## 2014-07-13 ENCOUNTER — Other Ambulatory Visit (HOSPITAL_COMMUNITY): Payer: Self-pay

## 2014-07-13 ENCOUNTER — Other Ambulatory Visit: Payer: Self-pay

## 2014-07-13 ENCOUNTER — Inpatient Hospital Stay (HOSPITAL_COMMUNITY)
Admission: EM | Admit: 2014-07-13 | Discharge: 2014-07-18 | DRG: 252 | Disposition: A | Discharge status: Home / Self Care | Payer: Medicaid Other | Attending: Internal Medicine | Admitting: Internal Medicine

## 2014-07-13 DIAGNOSIS — N186 End stage renal disease: Secondary | ICD-10-CM | POA: Diagnosis present

## 2014-07-13 DIAGNOSIS — Z7982 Long term (current) use of aspirin: Secondary | ICD-10-CM

## 2014-07-13 DIAGNOSIS — D696 Thrombocytopenia, unspecified: Secondary | ICD-10-CM | POA: Diagnosis present

## 2014-07-13 DIAGNOSIS — I10 Essential (primary) hypertension: Secondary | ICD-10-CM

## 2014-07-13 DIAGNOSIS — I69354 Hemiplegia and hemiparesis following cerebral infarction affecting left non-dominant side: Secondary | ICD-10-CM | POA: Diagnosis not present

## 2014-07-13 DIAGNOSIS — T82590A Other mechanical complication of surgically created arteriovenous fistula, initial encounter: Secondary | ICD-10-CM

## 2014-07-13 DIAGNOSIS — R531 Weakness: Secondary | ICD-10-CM

## 2014-07-13 DIAGNOSIS — T82898A Other specified complication of vascular prosthetic devices, implants and grafts, initial encounter: Secondary | ICD-10-CM | POA: Diagnosis not present

## 2014-07-13 DIAGNOSIS — E871 Hypo-osmolality and hyponatremia: Secondary | ICD-10-CM | POA: Diagnosis not present

## 2014-07-13 DIAGNOSIS — E119 Type 2 diabetes mellitus without complications: Secondary | ICD-10-CM | POA: Diagnosis present

## 2014-07-13 DIAGNOSIS — Z992 Dependence on renal dialysis: Secondary | ICD-10-CM

## 2014-07-13 DIAGNOSIS — K859 Acute pancreatitis, unspecified: Secondary | ICD-10-CM | POA: Diagnosis present

## 2014-07-13 DIAGNOSIS — Z452 Encounter for adjustment and management of vascular access device: Secondary | ICD-10-CM

## 2014-07-13 DIAGNOSIS — K8500 Idiopathic acute pancreatitis without necrosis or infection: Secondary | ICD-10-CM

## 2014-07-13 DIAGNOSIS — I4581 Long QT syndrome: Secondary | ICD-10-CM | POA: Diagnosis present

## 2014-07-13 DIAGNOSIS — E872 Acidosis, unspecified: Secondary | ICD-10-CM

## 2014-07-13 DIAGNOSIS — N189 Chronic kidney disease, unspecified: Secondary | ICD-10-CM

## 2014-07-13 DIAGNOSIS — I120 Hypertensive chronic kidney disease with stage 5 chronic kidney disease or end stage renal disease: Secondary | ICD-10-CM | POA: Diagnosis present

## 2014-07-13 DIAGNOSIS — I959 Hypotension, unspecified: Secondary | ICD-10-CM | POA: Diagnosis present

## 2014-07-13 DIAGNOSIS — I9589 Other hypotension: Secondary | ICD-10-CM

## 2014-07-13 DIAGNOSIS — J849 Interstitial pulmonary disease, unspecified: Secondary | ICD-10-CM

## 2014-07-13 DIAGNOSIS — E785 Hyperlipidemia, unspecified: Secondary | ICD-10-CM | POA: Diagnosis present

## 2014-07-13 DIAGNOSIS — E875 Hyperkalemia: Secondary | ICD-10-CM | POA: Diagnosis present

## 2014-07-13 DIAGNOSIS — D631 Anemia in chronic kidney disease: Secondary | ICD-10-CM | POA: Insufficient documentation

## 2014-07-13 DIAGNOSIS — Y829 Unspecified medical devices associated with adverse incidents: Secondary | ICD-10-CM | POA: Diagnosis present

## 2014-07-13 DIAGNOSIS — N19 Unspecified kidney failure: Secondary | ICD-10-CM

## 2014-07-13 DIAGNOSIS — T82868A Thrombosis of vascular prosthetic devices, implants and grafts, initial encounter: Secondary | ICD-10-CM | POA: Diagnosis present

## 2014-07-13 DIAGNOSIS — T8249XA Other complication of vascular dialysis catheter, initial encounter: Secondary | ICD-10-CM

## 2014-07-13 HISTORY — DX: Essential (primary) hypertension: I10

## 2014-07-13 HISTORY — DX: Type 2 diabetes mellitus without complications: E11.9

## 2014-07-13 HISTORY — DX: Chronic kidney disease, unspecified: N18.9

## 2014-07-13 HISTORY — DX: Hyperlipidemia, unspecified: E78.5

## 2014-07-13 HISTORY — DX: Cerebral infarction, unspecified: I63.9

## 2014-07-13 LAB — CBC WITH DIFFERENTIAL/PLATELET
Basophils Absolute: 0 10*3/uL (ref 0.0–0.1)
Basophils Relative: 0 % (ref 0–1)
Eosinophils Absolute: 0 10*3/uL (ref 0.0–0.7)
Eosinophils Relative: 0 % (ref 0–5)
HCT: 24.6 % — ABNORMAL LOW (ref 39.0–52.0)
HEMOGLOBIN: 8.7 g/dL — AB (ref 13.0–17.0)
LYMPHS PCT: 10 % — AB (ref 12–46)
Lymphs Abs: 0.8 10*3/uL (ref 0.7–4.0)
MCH: 30.1 pg (ref 26.0–34.0)
MCHC: 35.4 g/dL (ref 30.0–36.0)
MCV: 85.1 fL (ref 78.0–100.0)
MONO ABS: 0.4 10*3/uL (ref 0.1–1.0)
MONOS PCT: 5 % (ref 3–12)
NEUTROS ABS: 6.4 10*3/uL (ref 1.7–7.7)
Neutrophils Relative %: 85 % — ABNORMAL HIGH (ref 43–77)
Platelets: 124 10*3/uL — ABNORMAL LOW (ref 150–400)
RBC: 2.89 MIL/uL — ABNORMAL LOW (ref 4.22–5.81)
RDW: 13.2 % (ref 11.5–15.5)
WBC: 7.5 10*3/uL (ref 4.0–10.5)

## 2014-07-13 LAB — COMPREHENSIVE METABOLIC PANEL
ALT: 21 U/L (ref 0–53)
ANION GAP: 26 — AB (ref 5–15)
AST: 10 U/L (ref 0–37)
Albumin: 3.4 g/dL — ABNORMAL LOW (ref 3.5–5.2)
Alkaline Phosphatase: 99 U/L (ref 39–117)
BUN: 184 mg/dL — ABNORMAL HIGH (ref 6–23)
CHLORIDE: 97 mmol/L (ref 96–112)
CO2: 7 mmol/L — CL (ref 19–32)
Calcium: 9 mg/dL (ref 8.4–10.5)
Creatinine, Ser: 27.1 mg/dL — ABNORMAL HIGH (ref 0.50–1.35)
GFR, EST AFRICAN AMERICAN: 2 mL/min — AB (ref >90)
GFR, EST NON AFRICAN AMERICAN: 2 mL/min — AB (ref >90)
Glucose, Bld: 220 mg/dL — ABNORMAL HIGH (ref 70–99)
Potassium: 5.8 mmol/L — ABNORMAL HIGH (ref 3.5–5.1)
SODIUM: 130 mmol/L — AB (ref 135–145)
TOTAL PROTEIN: 6.9 g/dL (ref 6.0–8.3)
Total Bilirubin: 0.5 mg/dL (ref 0.3–1.2)

## 2014-07-13 LAB — I-STAT CG4 LACTIC ACID, ED: Lactic Acid, Venous: 0.67 mmol/L (ref 0.5–2.0)

## 2014-07-13 LAB — LIPASE, BLOOD: Lipase: 105 U/L — ABNORMAL HIGH (ref 11–59)

## 2014-07-13 LAB — MRSA PCR SCREENING: MRSA by PCR: NEGATIVE

## 2014-07-13 LAB — CBG MONITORING, ED: Glucose-Capillary: 220 mg/dL — ABNORMAL HIGH (ref 70–99)

## 2014-07-13 LAB — GLUCOSE, CAPILLARY: GLUCOSE-CAPILLARY: 187 mg/dL — AB (ref 70–99)

## 2014-07-13 MED ORDER — PRISMASOL BGK 4/2.5 32-4-2.5 MEQ/L IV SOLN
INTRAVENOUS | Status: DC
  Administered 2014-07-14 (×7): via INTRAVENOUS_CENTRAL
  Filled 2014-07-13 (×14): qty 5000

## 2014-07-13 MED ORDER — MIDODRINE HCL 5 MG PO TABS
10.0000 mg | ORAL_TABLET | Freq: Three times a day (TID) | ORAL | Status: DC
  Administered 2014-07-13: 10 mg via ORAL
  Filled 2014-07-13 (×5): qty 2

## 2014-07-13 MED ORDER — PRISMASOL BGK 4/2.5 32-4-2.5 MEQ/L IV SOLN
INTRAVENOUS | Status: DC
  Administered 2014-07-14: via INTRAVENOUS_CENTRAL
  Filled 2014-07-13 (×3): qty 5000

## 2014-07-13 MED ORDER — ALTEPLASE 100 MG IV SOLR
4.0000 mg | INTRAVENOUS | Status: DC
  Filled 2014-07-13: qty 4

## 2014-07-13 MED ORDER — SODIUM CHLORIDE 0.9 % FOR CRRT
INTRAVENOUS_CENTRAL | Status: DC | PRN
  Filled 2014-07-13: qty 1000

## 2014-07-13 MED ORDER — SODIUM POLYSTYRENE SULFONATE 15 GM/60ML PO SUSP
30.0000 g | Freq: Once | ORAL | Status: AC
  Administered 2014-07-13: 30 g via ORAL
  Filled 2014-07-13 (×2): qty 120

## 2014-07-13 MED ORDER — PRISMASOL BGK 4/2.5 32-4-2.5 MEQ/L IV SOLN
INTRAVENOUS | Status: DC
  Administered 2014-07-14: via INTRAVENOUS_CENTRAL
  Filled 2014-07-13 (×2): qty 5000

## 2014-07-13 MED ORDER — FENTANYL CITRATE 0.05 MG/ML IJ SOLN
INTRAMUSCULAR | Status: AC
  Administered 2014-07-14: 50 ug via INTRAVENOUS
  Filled 2014-07-13: qty 2

## 2014-07-13 MED ORDER — HEPARIN SODIUM (PORCINE) 1000 UNIT/ML DIALYSIS
1000.0000 [IU] | INTRAMUSCULAR | Status: DC | PRN
  Administered 2014-07-14: 3000 [IU] via INTRAVENOUS_CENTRAL
  Filled 2014-07-13: qty 3
  Filled 2014-07-13 (×2): qty 6
  Filled 2014-07-13: qty 3

## 2014-07-13 NOTE — ED Notes (Signed)
Per IR, pt came in to have RLA fistula declot. Pt pressures were low prior to procedure and pt c/o N/V/D for 2 days. Pt last received dialysis Friday but did not receive full treatment.  Pt hx DM, HTN, TB 2009. CBG 187, BP 77/24, 100% Rm Air, HR 65

## 2014-07-13 NOTE — Consult Note (Signed)
Referring Provider: No ref. provider found Primary Care Physician:  Johny Sax, MD Primary Nephrologist:  Dr. Gillian Scarce  Reason for Consultation: Management of uremia and metabolic acidosis and hypotension Mild hyperkalemia  HPI:  This is a very nice man that dialyzes in [place] and was sent to [facility] due to technical issues surrounding his dialysis treatments. It appears that there have been several attempts at dialysis and reportedly his last successful treatment was about Wednesday last week. The treatment did not go well and it appears that it was limited by low blood pressure during ultrafiltration. The details of the dialysis runs are not available at the time of this note being written. He was sent to IR for an evaluation of his vascular access and declot. Unfortunately he was acutely hemodynamically unstable during his time in IR and was promptly referred to the ER and evaluated by Dr Preston Fleeting. He received a fluid bolus and his BP improved although his still unstable. Labs in the ER are remarkable for a BUN 180 and bicarbonate 7 and potassium 5,8. Mr Blahnik has not considered end of life decisions to my knowledge and appears interested in aggressive management.  In the setting of Uremia and hemodynamic instability and metabolic acidosis. It would be prudent to evaluate for sepsis and start CRRT. I have discussed this with critical care and Dr Jerral Ralph. A dialysis catheter is to be placed and ICU admission arranged.  No past medical history on file.  No past surgical history on file.  Prior to Admission medications   Medication Sig Start Date End Date Taking? Authorizing Provider  acetaminophen (TYLENOL) 500 MG tablet Take 500 mg by mouth every 6 (six) hours as needed.   Yes Historical Provider, MD  amLODipine (NORVASC) 10 MG tablet Take 10 mg by mouth daily.   Yes Historical Provider, MD  aspirin EC 81 MG tablet Take 81 mg by mouth daily.   Yes Historical Provider, MD  Capsaicin 0.035  % CREA Apply 1 application topically 3 (three) times daily as needed (for sore areas).   Yes Historical Provider, MD  cinacalcet (SENSIPAR) 90 MG tablet Take 90 mg by mouth daily.   Yes Historical Provider, MD  glipiZIDE (GLUCOTROL) 10 MG tablet Take 10 mg by mouth daily before breakfast.   Yes Historical Provider, MD  lanthanum (FOSRENOL) 1000 MG chewable tablet Chew 1,000-2,000 mg by mouth as directed.  with meals,  with snacks   Yes Historical Provider, MD  lisinopril (PRINIVIL,ZESTRIL) 20 MG tablet Take 20 mg by mouth daily.   Yes Historical Provider, MD  simvastatin (ZOCOR) 10 MG tablet Take 10 mg by mouth daily.   Yes Historical Provider, MD    Current Facility-Administered Medications  Medication Dose Route Frequency Provider Last Rate Last Dose  . midodrine (PROAMATINE) tablet 10 mg  10 mg Oral TID WC Dione Booze, MD   10 mg at 07/13/14 1843   Current Outpatient Prescriptions  Medication Sig Dispense Refill  . acetaminophen (TYLENOL) 500 MG tablet Take 500 mg by mouth every 6 (six) hours as needed.    Marland Kitchen amLODipine (NORVASC) 10 MG tablet Take 10 mg by mouth daily.    Marland Kitchen aspirin EC 81 MG tablet Take 81 mg by mouth daily.    . Capsaicin 0.035 % CREA Apply 1 application topically 3 (three) times daily as needed (for sore areas).    . cinacalcet (SENSIPAR) 90 MG tablet Take 90 mg by mouth daily.    Marland Kitchen glipiZIDE (GLUCOTROL) 10 MG  tablet Take 10 mg by mouth daily before breakfast.    . lanthanum (FOSRENOL) 1000 MG chewable tablet Chew 1,000-2,000 mg by mouth as directed.  with meals,  with snacks    . lisinopril (PRINIVIL,ZESTRIL) 20 MG tablet Take 20 mg by mouth daily.    . simvastatin (ZOCOR) 10 MG tablet Take 10 mg by mouth daily.     Facility-Administered Medications Ordered in Other Encounters  Medication Dose Route Frequency Provider Last Rate Last Dose  . alteplase (ACTIVASE) injection 4 mg  4 mg Intracatheter to Loma Newton, MD        Allergies as of  07/13/2014  . (No Known Allergies)    No family history on file.  History   Social History  . Marital Status: Married    Spouse Name: N/A  . Number of Children: N/A  . Years of Education: N/A   Occupational History  . Not on file.   Social History Main Topics  . Smoking status: Not on file  . Smokeless tobacco: Not on file  . Alcohol Use: Not on file  . Drug Use: Not on file  . Sexual Activity: Not on file   Other Topics Concern  . Not on file   Social History Narrative  . No narrative on file    Review of Systems: Gen:  No fever non toxic HEENT: No visual complaints, No history of Retinopathy. Normal external appearance No Epistaxis or Sore throat. No sinusitis.   CV: Denies chest pain, angina, palpitations, syncope, orthopnea, PND, peripheral edema, and claudication. Resp: Denies dyspnea at rest, dyspnea with exercise, cough, sputum, wheezing, coughing up blood, and pleurisy. GI: Denies vomiting blood, jaundice, and fecal incontinence.   Denies dysphagia or odynophagia. + abdominal pain GU : Denies urinary burning, blood in urine, urinary frequency, urinary hesitancy, nocturnal urination, and urinary incontinence.  No renal calculi. MS: Denies joint pain, limitation of movement, and swelling, stiffness, low back pain, extremity pain. Denies muscle weakness, cramps, atrophy.  No use of non steroidal antiinflammatory drugs. Derm: Denies rash, itching, dry skin, hives, moles, warts, or unhealing ulcers.  Psych: Denies depression, anxiety, memory loss, suicidal ideation, hallucinations, paranoia, and confusion. Heme: Denies bruising, bleeding, and enlarged lymph nodes. Neuro: No headache.  No diplopia. No dysarthria.  No dysphasia.  No history of CVA.  No Seizures. No paresthesias.  No weakness. Endocrine No DM.  No Thyroid disease.  No Adrenal disease.  Physical Exam: Vital signs in last 24 hours: Pulse Rate:  [66-77] 66 (02/22 2015) Resp:  [11-19] 11 (02/22 2015) BP:  (77-126)/(14-97) 101/47 mmHg (02/22 2015) SpO2:  [92 %-100 %] 100 % (02/22 2015) Weight:  [85 kg (187 lb 6.3 oz)] 85 kg (187 lb 6.3 oz) (02/22 1409)   General:   Non toxic appearing but chronically ill    [place] Head:  Normocephalic and atraumatic. Eyes:  Sclera clear, no icterus.   Conjunctiva pink. Ears:  Normal auditory acuity. Nose:  No deformity, discharge,  or lesions. Mouth:  No deformity or lesions, dentition normal. Neck:  Supple; no masses or thyromegaly. JVP not elevated Lungs:  Clear throughout to auscultation.   No wheezes, crackles, or rhonchi. No acute distress. Heart:  Regular rate and rhythm; no murmurs, clicks, rubs,  or gallops. Abdomen:  Soft, nontender and nondistended. No masses, hepatosplenomegaly or hernias noted. Normal bowel sounds, without guarding, and without rebound.   Msk:  Symmetrical without gross deformities. Normal posture. Pulses:  No carotid, renal, femoral bruits.  DP and PT symmetrical and equal Extremities:  Without clubbing or edema. Neurologic:  Alert and  oriented x4;  grossly normal neurologically. Skin:  Intact without significant lesions or rashes. Cervical Nodes:  No significant cervical adenopathy. Psych:  Alert and cooperative. Normal mood and affect.  Intake/Output from previous day:   Intake/Output this shift:    Lab Results:  Recent Labs  07/13/14 1617  WBC 7.5  HGB 8.7*  HCT 24.6*  PLT 124*   BMET  Recent Labs  07/13/14 1617  NA 130*  K 5.8*  CL 97  CO2 7*  GLUCOSE 220*  BUN 184*  CREATININE 27.10*  CALCIUM 9.0   LFT  Recent Labs  07/13/14 1617  PROT 6.9  ALBUMIN 3.4*  AST 10  ALT 21  ALKPHOS 99  BILITOT 0.5   PT/INR No results for input(s): LABPROT, INR in the last 72 hours. Hepatitis Panel No results for input(s): HEPBSAG, HCVAB, HEPAIGM, HEPBIGM in the last 72 hours.  Studies/Results: Dg Chest 2 View  07/13/2014   CLINICAL DATA:  Weakness and hypotension for 2 days  EXAM: CHEST  2 VIEW   COMPARISON:  July 13, 2014 study obtained earlier in the day  FINDINGS: There is no edema or consolidation. Heart is mildly enlarged with pulmonary vascularity within normal limits. No adenopathy. There is postoperative change in the lower cervical spine.  IMPRESSION: Mild cardiac enlargement.  No edema or consolidation.   Electronically Signed   By: Bretta BangWilliam  Woodruff III M.D.   On: 07/13/2014 16:19    Assessment/Plan:  End Stage Renal Disease with a dialysis access malfunction /  Thrombosed access and inability to perform dialysis. He appears to have a marked degree of uremia and is under dialyzed. I find it suspect that the BUN is remarkably high and would suggest a longer period of lack of adequate dialysis treatment.   Hyperkalemia mild  Metabolic acidosis due to anephric state and ESRD  Consider ischemic bowel as a differential   Hypotension  Instability most worrisome for underlying sepsis/ ischemia  Would proceed to CRRT management at this point   LOS: 0 Sh[facility]n [facility] W @TODAY @9 :21 PM

## 2014-07-13 NOTE — ED Provider Notes (Signed)
CSN: [id number]     Arrival date & time 07/13/14  1346 History   First MD Initiated Contact with Patient 07/13/14 1435     Chief Complaint  Patient presents with  . Hypotension     (Consider location/radiation/quality/duration/timing/severity/associated sxs/prior Treatment) HPI  No past medical history on file. No past surgical history on file. No family history on file. History  Substance Use Topics  . Smoking status: Not on file  . Smokeless tobacco: Not on file  . Alcohol Use: Not on file    Review of Systems  Constitutional: Positive for fatigue.  Neurological: Positive for weakness.  All other systems reviewed and are negative.     Allergies  Review of patient's allergies indicates no known allergies.  Home Medications   Prior to Admission medications   Medication Sig Start Date End Date Taking? Authorizing Provider  acetaminophen (TYLENOL) 500 MG tablet Take 500 mg by mouth every 6 (six) hours as needed.   Yes Historical Provider, MD  amLODipine (NORVASC) 10 MG tablet Take 10 mg by mouth daily.   Yes Historical Provider, MD  aspirin EC 81 MG tablet Take 81 mg by mouth daily.   Yes Historical Provider, MD  Capsaicin 0.035 % CREA Apply 1 application topically 3 (three) times daily as needed (for sore areas).   Yes Historical Provider, MD  cinacalcet (SENSIPAR) 90 MG tablet Take 90 mg by mouth daily.   Yes Historical Provider, MD  glipiZIDE (GLUCOTROL) 10 MG tablet Take 10 mg by mouth daily before breakfast.   Yes Historical Provider, MD  lanthanum (FOSRENOL) 1000 MG chewable tablet Chew 1,000-2,000 mg by mouth as directed.  with meals,  with snacks   Yes Historical Provider, MD  lisinopril (PRINIVIL,ZESTRIL) 20 MG tablet Take 20 mg by mouth daily.   Yes Historical Provider, MD  simvastatin (ZOCOR) 10 MG tablet Take 10 mg by mouth daily.   Yes Historical Provider, MD   BP 89/21 mmHg  Pulse 70  Resp 13  Ht  (1.6 m)  Wt 187 lb 6.3 oz (85 kg)  BMI  33.20 kg/m2  SpO2 100% Physical Exam  Constitutional: He is oriented to person, place, and time. He appears well-developed and well-nourished.  HENT:  Head: Normocephalic and atraumatic.  Right Ear: External ear normal.  Left Ear: External ear normal.  Nose: Nose normal.  Mouth/Throat: Oropharynx is clear and moist.  Eyes: Conjunctivae are normal. Pupils are equal, round, and reactive to light.  Neck: Normal range of motion.  Cardiovascular: Normal rate.   Pulmonary/Chest: Effort normal and breath sounds normal.  Abdominal: Soft. He exhibits no distension.  Musculoskeletal: Normal range of motion.  Neurological: He is alert and oriented to person, place, and time.  Skin: Skin is warm.  Psychiatric: He has a normal mood and affect.  Nursing note and vitals reviewed.   ED Course  Procedures (including critical care time) Labs Review Labs Reviewed  CBG MONITORING, ED - Abnormal; Notable for the following:    Glucose-Capillary 220 (*)    All other components within normal limits  CBC WITH DIFFERENTIAL/PLATELET  COMPREHENSIVE METABOLIC PANEL  LIPASE, BLOOD    Imaging Review No results found.   EKG Interpretation None      MDM   Final diagnoses:  Weakness    Dr. Preston Fleeting in to see and examine.  Pt's care turned over to Dr. Yetta Glassman, PA-C 07/13/14 [address] [place], [place] 07/13/14 1620  Onalee Hua  Preston FleetingGlick, MD 07/13/14 [phone]

## 2014-07-13 NOTE — ED Notes (Addendum)
Blood pressure cuff on L calf reading diastolic<20, changed cuff to L thigh, now BP reading 112/55

## 2014-07-13 NOTE — ED Notes (Signed)
CRITICAL VALUE ALERT  Critical value received:  C02  Date of notification:  07/13/14  Time of notification:  0604  Critical value read back:Yes.    Nurse who received alert:  Gershon Musselreama Glenda Spelman  MD notified (1st page):  Dr. Preston FleetingGlick  Time of first page:  [phone]

## 2014-07-13 NOTE — ED Notes (Signed)
CBG 220. 

## 2014-07-13 NOTE — ED Notes (Signed)
Attempted to call report

## 2014-07-13 NOTE — ED Notes (Signed)
Pt able to tolerate fluids and solids.

## 2014-07-13 NOTE — ED Notes (Signed)
RN attempted to obtain labs- unsuccessful. Will notify phlebotomy

## 2014-07-13 NOTE — ED Provider Notes (Addendum)
54 year old male. He denies he has been given some fluid and blood pressure has come up to the high 80s systolic. On exam, lungs are clear and heart has regular rate and rhythm. There is moderate right upper quadrant tenderness without rebound or guarding. Bowel sounds are present. well as leg pain.He states he does have problems with hypotension and dialysis has had to be stopped in the past because of low blood pressure.  After IV fluids, blood pressure has come up to 120 systolic. Laboratory workup shows severe uremia with BUN 184 and creatinine 27.1. Potassium is mildly elevated at 5.8 and ECG shows no evidence of hyperkalemia. Is given a dose of Kayexalate. Lipase is significantly elevated. Given his abdominal pain, I do feel that this is true pancreatitis and not elevation just because of renal failure. Marked metabolic acidosis is present with CO2 of 7 and anion gap of 26. I spoke with him about his dialysis and apparently, his last full dialysis session was 7 days ago. 5 days ago, and dialysis had to be stopped after 2 hours because of low blood pressure. He was not able to be dialyzed 3 days ago or today because of the clotted access. Blood pressure has stabilized in the ED. Case is discussed with Dr. Jerral RalphGhimire of [facility] who agrees to admit the patient. He is given a dose of midodrine to help stabilize blood pressure of.  Results for orders placed or performed during the hospital encounter of 07/13/14  CBC with Differential/Platelet  Result Value Ref Range   WBC 7.5 4.0 - 10.5 K/uL   RBC 2.89 (L) 4.22 - 5.81 MIL/uL   Hemoglobin 8.7 (L) 13.0 - 17.0 g/dL   HCT 46.924.6 (L) 62.939.0 - 52.852.0 %   MCV 85.1 78.0 - 100.0 fL   MCH 30.1 26.0 - 34.0 pg   MCHC 35.4 30.0 - 36.0 g/dL   RDW 41.313.2 24.411.5 - 01.015.5 %   Platelets 124 (L) 150 - 400 K/uL   Neutrophils Relative % 85 (H) 43 - 77 %   Neutro Abs 6.4 1.7 - 7.7 K/uL   Lymphocytes Relative 10 (L) 12 - 46 %   Lymphs Abs 0.8 0.7 - 4.0 K/uL   Monocytes  Relative 5 3 - 12 %   Monocytes Absolute 0.4 0.1 - 1.0 K/uL   Eosinophils Relative 0 0 - 5 %   Eosinophils Absolute 0.0 0.0 - 0.7 K/uL   Basophils Relative 0 0 - 1 %   Basophils Absolute 0.0 0.0 - 0.1 K/uL  Comprehensive metabolic panel  Result Value Ref Range   Sodium 130 (L) 135 - 145 mmol/L   Potassium 5.8 (H) 3.5 - 5.1 mmol/L   Chloride 97 96 - 112 mmol/L   CO2 7 (LL) 19 - 32 mmol/L   Glucose, Bld 220 (H) 70 - 99 mg/dL   BUN 272184 (H) 6 - 23 mg/dL   Creatinine, Ser 53.6627.10 (H) 0.50 - 1.35 mg/dL   Calcium 9.0 8.4 - 44.010.5 mg/dL   Total Protein 6.9 6.0 - 8.3 g/dL   Albumin 3.4 (L) 3.5 - 5.2 g/dL   AST 10 0 - 37 U/L   ALT 21 0 - 53 U/L   Alkaline Phosphatase 99 39 - 117 U/L   Total Bilirubin 0.5 0.3 - 1.2 mg/dL   GFR calc non Af Amer 2 (L) >90 mL/min   GFR calc Af Amer 2 (L) >90 mL/min   Anion gap 26 (H) 5 - 15  Lipase, blood  Result Value Ref Range   Lipase 105 (H) 11 - 59 U/L  CBG monitoring, ED  Result Value Ref Range   Glucose-Capillary 220 (H) 70 - 99 mg/dL   Dg Chest 2 View  5/62/1308   CLINICAL DATA:  Weakness and hypotension for 2 days  EXAM: CHEST  2 VIEW  COMPARISON:  July 13, 2014 study obtained earlier in the day  FINDINGS: There is no edema or consolidation. Heart is mildly enlarged with pulmonary vascularity within normal limits. No adenopathy. There is postoperative change in the lower cervical spine.  IMPRESSION: Mild cardiac enlargement.  No edema or consolidation.   Electronically Signed   By: Bretta Bang III M.D.   On: 07/13/2014 16:19     ICD-9-CM ICD-10-CM   1. Hypotension, unspecified hypotension type 458.9 I95.9   2. Weakness 780.79 R53.1 DG Chest 2 View     DG Chest 2 View  3. Uremia 586 N19   4. Idiopathic acute pancreatitis 577.0 K85.0   5. Metabolic acidosis 276.2 E87.2   6. Anemia of renal disease 285.21 D63.1   7. End-stage renal disease on hemodialysis 585.6 N18.6    V45.11 Z99.2   8. Hyperkalemia 276.7 E87.5     CRITICAL  CARE Performed by: MVHQI,ONGEX Total critical care time: 45 minutes Critical care time was exclusive of separately billable procedures and treating other patients. Critical care was necessary to treat or prevent imminent or life-threatening deterioration. Critical care was time spent personally by me on the following activities: development of treatment plan with patient and/or surrogate as well as nursing, discussions with consultants, evaluation of patient's response to treatment, examination of patient, obtaining history from patient or surrogate, ordering and performing treatments and interventions, ordering and review of laboratory studies, ordering and review of radiographic studies, pulse oximetry and re-evaluation of patient's condition.  Medical screening examination/treatment/procedure(s) were conducted as a shared visit with non-physician practitioner(s) and myself.  I personally evaluated the patient during the encounter.   Date: 07/13/2014  Rate: 74  Rhythm: normal sinus rhythm  QRS Axis: normal  Intervals: PR prolonged and QT prolonged  ST/T Wave abnormalities: nonspecific ST/T changes  Conduction Disutrbances:first-degree A-V block   Narrative Interpretation: First-degree A-V block with borderline prolonged QT interval, left ventricular hypertrophy with secondary repolarization changes. When compared with ECG of 01/26/2010, there is been improvement in ST and T changes.  Old EKG Reviewed: changes noted       Dione Booze, MD 07/13/14 1843  Dione Booze, MD 07/13/14 [phone]

## 2014-07-13 NOTE — H&P (Signed)
PATIENT DETAILS Name: LUCIUS WISE Age: 54 y.o. Sex: male Date of Birth: [birth year] Admit Date: 07/13/2014 WUJ:WJXBJYN Hatcher, MD   CHIEF COMPLAINT:  Referred from interventional radiology for hypotension  HPI: NOCHUM FENTER is a 54 y.o. male with a Past Medical History of end-stage renal disease on hemodialysis on M, W, F diabetes, hypertension, history of CVA with mild left-sided weakness, dyslipidemia who presents today with the above noted complaint. Per patient, his last full usual dialysis was last Monday (07/06/14), this past Wednesday he was only partially dialyze because of technical issues, and had very.her dialysis on Friday because of access issues. He was then referred by his nephrologist to [facility] for evaluation by radiology for a possible fistulogram, who subsequently referred him to interventional radiology at [facility] for further evaluation. When patient arrived at interventional radiology, he was found to be hypotensive, he was then immediately transferred to [facility] emergency room for further evaluation. He was provided with supportive care, IV fluids, following which his blood pressure significantly improved and I was then asked to admit this patient for further evaluation and treatment. His lab work showed significant metabolic acidosis and a significantly elevated BUN. Patient claims that since this past Wednesday, he is had intermittent upper abdominal pain and vomiting. He denies any fever, headache, shortness of breath or chest pain. He denies any leg swelling. There is no history of diarrhea. His last bowel movement was here in the emergency room. Patient does endorse some weakness and fatigue since this past Wednesday as well. Patient is now being admitted to the hospital for further evaluation and treatment.   ALLERGIES:  No Known Allergies  PAST MEDICAL HISTORY: Hypertension Type 2 diabetes CVA with left-sided  weakness Dyslipidemia  PAST SURGICAL HISTORY: Cholecystectomy Hemodialysis access  MEDICATIONS AT HOME: Prior to Admission medications   Medication Sig Start Date End Date Taking? Authorizing Provider  acetaminophen (TYLENOL) 500 MG tablet Take 500 mg by mouth every 6 (six) hours as needed.   Yes Historical Provider, MD  amLODipine (NORVASC) 10 MG tablet Take 10 mg by mouth daily.   Yes Historical Provider, MD  aspirin EC 81 MG tablet Take 81 mg by mouth daily.   Yes Historical Provider, MD  Capsaicin 0.035 % CREA Apply 1 application topically 3 (three) times daily as needed (for sore areas).   Yes Historical Provider, MD  cinacalcet (SENSIPAR) 90 MG tablet Take 90 mg by mouth daily.   Yes Historical Provider, MD  glipiZIDE (GLUCOTROL) 10 MG tablet Take 10 mg by mouth daily before breakfast.   Yes Historical Provider, MD  lanthanum (FOSRENOL) 1000 MG chewable tablet Chew 1,000-2,000 mg by mouth as directed.  with meals,  with snacks   Yes Historical Provider, MD  lisinopril (PRINIVIL,ZESTRIL) 20 MG tablet Take 20 mg by mouth daily.   Yes Historical Provider, MD  simvastatin (ZOCOR) 10 MG tablet Take 10 mg by mouth daily.   Yes Historical Provider, MD    FAMILY HISTORY: Mother/father-chronic kidney disease  SOCIAL HISTORY:  has no tobacco, alcohol, and drug history on file.  REVIEW OF SYSTEMS:  Constitutional:   No  weight loss, night sweats,  Fevers  HEENT:    No headaches, Difficulty swallowing,Tooth/dental problems,Sore throat   Cardio-vascular: No chest pain,  Orthopnea, PND, swelling in lower extremities  GI:  No heartburn, indigestion, abdominal pain, nausea, vomiting, diarrhea  Resp: No shortness of breath with exertion or at rest.  No excess mucus, no productive cough, No non-productive cough,  No coughing up of blood.No change in color of mucus.No wheezing.No chest wall deformity  Skin:  no rash or lesions.  GU:  no dysuria, change in color of urine,  no urgency or frequency.  No flank pain.  Musculoskeletal: No joint pain or swelling.  No decreased range of motion.  No back pain.  Psych: No change in mood or affect. No depression or anxiety.  No memory loss.   PHYSICAL EXAM: Blood pressure 120/25, pulse 72, resp. rate 14, height 5\' 3"  (1.6 m), weight 85 kg (187 lb 6.3 oz), SpO2 100 %.  General appearance :Awake, alert, not in any distress. Speech Clear. Not toxic Looking. He does appear chronically sick looking HEENT: Atraumatic and Normocephalic, pupils equally reactive to light and accomodation Neck: supple, no JVD. No cervical lymphadenopathy.  Chest:Good air entry bilaterally, no added sounds  CVS: S1 S2 regular, no murmurs.  Abdomen: Bowel sounds present, Non tender and not distended with no gaurding, rigidity or rebound. Extremities: B/L Lower Ext shows no edema, both legs are warm to touch Neurology: Awake alert, and oriented X 3, CN II-XII intact, mild left upper and left lower extremity weakness Skin:No Rash Wounds:N/A  LABS ON ADMISSION:   Recent Labs  07/13/14 1617  NA 130*  K 5.8*  CL 97  CO2 7*  GLUCOSE 220*  BUN 184*  CREATININE 27.10*  CALCIUM 9.0    Recent Labs  07/13/14 1617  AST 10  ALT 21  ALKPHOS 99  BILITOT 0.5  PROT 6.9  ALBUMIN 3.4*    Recent Labs  07/13/14 1617  LIPASE 105*    Recent Labs  07/13/14 1617  WBC 7.5  NEUTROABS 6.4  HGB 8.7*  HCT 24.6*  MCV 85.1  PLT 124*   No results for input(s): CKTOTAL, CKMB, CKMBINDEX, TROPONINI in the last 72 hours. No results for input(s): DDIMER in the last 72 hours. Invalid input(s): POCBNP   RADIOLOGIC STUDIES ON ADMISSION: Dg Chest 2 View  07/13/2014   CLINICAL DATA:  Weakness and hypotension for 2 days  EXAM: CHEST  2 VIEW  COMPARISON:  July 13, 2014 study obtained earlier in the day  FINDINGS: There is no edema or consolidation. Heart is mildly enlarged with pulmonary vascularity within normal limits. No adenopathy. There  is postoperative change in the lower cervical spine.  IMPRESSION: Mild cardiac enlargement.  No edema or consolidation.   Electronically Signed   By: Bretta BangWilliam  Woodruff III M.D.   On: 07/13/2014 16:19    ASSESSMENT AND PLAN: Present on Admission:  . Uremia: Secondary to missed hemodialysis. Patient has significant metabolic acidosis and significantly elevated BUN-this is likely secondary to several missed/incomplete hemodialysis sessions in the past one week.  Unfortunately, interventional radiology was unable to perform declotting procedure in his right arm fistula because of hypotension. I have spoken with nephrology on call-Dr. Hyman HopesWebb, who requests that we consult PCCM for hemodialysis catheter placement. Dr. Hyman HopesWebb to evaluate and patient would either undergo hemodialysis or CVVHD later this evening. I have spoken with PCCM-Dr. Vassie LollAlva.   . Transient Hypotension (arterial): Per patient, this is a chronic issue for him. Thankfully this has resolved with some gentle hydration here in the emergency room. No other causes evident at this time, he does not appear to be febrile him and has no leukocytosis. Chest x-ray was negative for pneumonia, patient does not make any urine. Evaluate UA. Will place him on [place], and follow  BP trend. Blood cultures will be drawn if febrile.   . ? Pancreatitis:? Secondary to uremia. Patient is status post cholecystectomy, and he denies alcohol use. Currently he denies any abdominal pain, his abdomen is very benign and soft without any tenderness. We will repeat lipase levels in am, await nephrology evaluation for possible dialysis/CVVHD. Follow clinical course. If abdominal pain worsens/recurs-may need further workup with a CT scan.  . Essential hypertension: Hold all antihypertensive medications   . End stage renal disease: See above   . Dyslipidemia: Hold statins  . History of CVA with mild left-sided weakness: Resume aspirin once oral intake stabilizes and diet has  advanced.  Further plan will depend as patient's clinical course evolves and further radiologic and laboratory data become available. Patient will be monitored closely.  Above noted plan was discussed with patient, he was in agreement.   DVT Prophylaxis: Prophylactic  Heparin  Code Status: Full Code  Disposition Plan: Home in 2-3 days  Total time spent for admission equals 45 minutes.  [facility] Pager [phone]  If 7PM-7AM, please contact night-coverage [email or web address] Password [facility] 07/13/2014, 7:03 PM

## 2014-07-13 NOTE — Consult Note (Signed)
PULMONARY / CRITICAL CARE MEDICINE   Name: Theodore Hurley MRN: [id number] DOB: July 20, 1960    ADMISSION DATE:  07/13/2014 CONSULTATION DATE:  07/13/2014  REFERRING MD :  Hyman Hopes  CHIEF COMPLAINT:  Hypotension, ESRD  INITIAL PRESENTATION:  54 y.o. M w ESRD (M/W/F - last full dialysis Mon 2/15) who was in IR suite 2/22 for declotting of fistula; however, was hypotensive so sent to ED for further evaluation.  Seen by nephrology who recommended ICU admission and initiation of CRRT.   STUDIES:  CXR 2/22 >>> no acute process  SIGNIFICANT EVENTS: 2/22 - admit   HISTORY OF PRESENT ILLNESS:   Theodore Hurley is a 54 y.o. 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  He presented to IR suite 2/22 for declotting of his fistula; however, was hypotensive on arrival so was sent to ED for further evaluation.  He apparently only had partial dialysis last Wednesday due to access issues. In ED, he was found to have metabolic acidosis, significantly elevated BUN / SCr, elevated lipase.  He also endorsed intermittent upper abd pain and vomiting since Wed 2/17.  He denies any fevers/chill/sweats, myalgias, chest pain, SOB, diarrhea.  In ED, he was seen by nephrology (Dr. Hyman Hopes) who advised ICU admission and initiation of CRRT.  PCCM consulted for admission and placement of temporary HD catheter.  PAST MEDICAL HISTORY :  ESRD (M/W/F), DM, HTN, HLD, CVA with mild residual left sided weakness.   has no past medical history on file.  has no past surgical history on file. Prior to Admission medications   Medication Sig Start Date End Date Taking? Authorizing Provider  acetaminophen (TYLENOL) 500 MG tablet Take 500 mg by mouth every 6 (six) hours as needed.   Yes Historical Provider, MD  amLODipine (NORVASC) 10 MG tablet Take 10 mg by mouth daily.   Yes Historical Provider, MD  aspirin EC 81 MG tablet Take  81 mg by mouth daily.   Yes Historical Provider, MD  Capsaicin 0.035 % CREA Apply 1 application topically 3 (three) times daily as needed (for sore areas).   Yes Historical Provider, MD  cinacalcet (SENSIPAR) 90 MG tablet Take 90 mg by mouth daily.   Yes Historical Provider, MD  glipiZIDE (GLUCOTROL) 10 MG tablet Take 10 mg by mouth daily before breakfast.   Yes Historical Provider, MD  lanthanum (FOSRENOL) 1000 MG chewable tablet Chew 1,000-2,000 mg by mouth as directed.  with meals,  with snacks   Yes Historical Provider, MD  lisinopril (PRINIVIL,ZESTRIL) 20 MG tablet Take 20 mg by mouth daily.   Yes Historical Provider, MD  simvastatin (ZOCOR) 10 MG tablet Take 10 mg by mouth daily.   Yes Historical Provider, MD   No Known Allergies  FAMILY HISTORY:  No family history on file.  SOCIAL HISTORY:  has no tobacco, alcohol, and drug history on file.  REVIEW OF SYSTEMS:   All negative; except for those that are bolded, which indicate positives.  Constitutional: weight loss, weight gain, night sweats, fevers, chills, fatigue, weakness.  HEENT: headaches, sore throat, sneezing, nasal congestion, post nasal drip, difficulty swallowing, tooth/dental problems, visual complaints, visual changes, ear aches. Neuro: difficulty with speech, weakness, numbness, ataxia. CV:  chest pain, orthopnea, PND, swelling in lower extremities, dizziness, palpitations, syncope.  Resp: cough, hemoptysis, dyspnea, wheezing. GI  heartburn, indigestion, abdominal pain, nausea, vomiting, diarrhea, constipation, change in  bowel habits, loss of appetite, hematemesis, melena, hematochezia.  GU: dysuria, change in color of urine, urgency or frequency, flank pain, hematuria. MSK: joint pain or swelling, decreased range of motion, leg pain. Psych: change in mood or affect, depression, anxiety, suicidal ideations, homicidal ideations. Skin: rash, itching, bruising.   SUBJECTIVE:   VITAL SIGNS: Temp:  [97.7 F  (36.5 C)] 97.7 F (36.5 C) (02/22 2216) Pulse Rate:  [59-77] 64 (02/22 2216) Resp:  [11-20] 12 (02/22 2216) BP: (77-130)/(14-97) 112/50 mmHg (02/22 2216) SpO2:  [92 %-100 %] 100 % (02/22 2216) Weight:  [85 kg (187 lb 6.3 oz)-85.5 kg (188 lb 7.9 oz)] 85.5 kg (188 lb 7.9 oz) (02/22 2216) HEMODYNAMICS:   VENTILATOR SETTINGS:   INTAKE / OUTPUT: Intake/Output    None     PHYSICAL EXAMINATION: General: Hispanic male, in NAD, speaks little english. Neuro: A&O x 3, non-focal.  HEENT: [place]/AT. PERRL, sclerae anicteric. Cardiovascular: RRR, no M/R/G.  Lungs: Respirations even and unlabored.  CTA bilaterally, No W/R/R. Abdomen: BS x 4, soft, ND.  Generalized tenderness with palpation. Musculoskeletal: No gross deformities, no edema.  Skin: Intact, warm, no rashes.  LABS:  CBC  Recent Labs Lab 07/13/14 1617  WBC 7.5  HGB 8.7*  HCT 24.6*  PLT 124*   Coag's No results for input(s): APTT, INR in the last 168 hours. BMET  Recent Labs Lab 07/13/14 1617  NA 130*  K 5.8*  CL 97  CO2 7*  BUN 184*  CREATININE 27.10*  GLUCOSE 220*   Electrolytes  Recent Labs Lab 07/13/14 1617  CALCIUM 9.0   Sepsis Markers  Recent Labs Lab 07/13/14 1843  LATICACIDVEN 0.67   ABG No results for input(s): PHART, PCO2ART, PO2ART in the last 168 hours. Liver Enzymes  Recent Labs Lab 07/13/14 1617  AST 10  ALT 21  ALKPHOS 99  BILITOT 0.5  ALBUMIN 3.4*   Cardiac Enzymes No results for input(s): TROPONINI, PROBNP in the last 168 hours. Glucose  Recent Labs Lab 07/13/14 1337 07/13/14 1409  GLUCAP 187* 220*    Imaging Dg Chest 2 View  07/13/2014   CLINICAL DATA:  Weakness and hypotension for 2 days  EXAM: CHEST  2 VIEW  COMPARISON:  July 13, 2014 study obtained earlier in the day  FINDINGS: There is no edema or consolidation. Heart is mildly enlarged with pulmonary vascularity within normal limits. No adenopathy. There is postoperative change in the lower cervical spine.   IMPRESSION: Mild cardiac enlargement.  No edema or consolidation.   Electronically Signed   By: Bretta BangWilliam  Woodruff III M.D.   On: 07/13/2014 16:19     ASSESSMENT / PLAN:  PULMONARY A: No acute process P:   Pulmonary hygiene. pcxr in am for int prominence  CARDIOVASCULAR R Fem HD cath 2/22 >>> R Fem A line 2/22 >>> A:  Hypotension - reportedly chronic per pt.  Has resolved after gentle hydration. Hx HTN, HLD P:  Monitor hemodynamics. Continue midodrine (started in ED this admission). Hold outpatient amlodipine, lisinopril, simvastatin. Assess trop with hypotension presentation Assess cortisol (ifBP drops), tsh May need echo assessment abg x 1  RENAL A:   ESRD on dialysis M/W/F Uremia AG metabolic acidosis - due to uremia / renal failure Hyponatremia Mild hyperkalemia - received kayxelate in ED P:   Nephrology following - to start CRRT tonight. Trend lactate BMP in AM.  GASTROINTESTINAL A:   ? Pancreatitis - lipase 105 + generalized abd pain Nutrition Hypoperfusion lipase? P:   Repeat lipase  in AM, awaited Follow stool NPO  HEMATOLOGIC A:   Anemia (dilutional likely) Thrombocytopenia VTE Prophylaxis P:  Transfuse per usual ICU guidelines. Monitor platelets. SCD's / Heparin.(no bleeding noted) Will d/w renal epo need CBC in AM. Send iron panel, ferr, tibc  INFECTIOUS A:   AT RISK for vascula infection, infected clot P:   Monitor clinically. Pct assessment with unclear hypotension in setting ESRD Low threshold for empiric ABX, ceftaz, vanc (will dose) This is the patient group that fly under radar and difficult to detect sepsis Ensure BC were sent Ensure 30 cc/kg bolus was given  ENDOCRINE A:   DM  P:   CBG's q4hr. SSI. Hold outpatient glipizide.  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 Low dose fentanyl PRN.   Family updated: None.  Interdisciplinary Family Meeting v Palliative Care  Meeting:  Due by: 2/29.  CC Time:  40 minutes.   Rutherford Guys, [place] Sidonie Dickens [facility] Pager: [phone]  or [phone] 07/13/2014, 10:32 PM  STAFF NOTE: I, Rory Percy, MD FACP have personally reviewed patient's available data, including medical history, events of note, physical examination and test results as part of my evaluation. I have discussed with resident/NP and other care providers such as pharmacist, RN and RRT. In addition, I personally evaluated patient and elicited key findings of: hypotension presentation, ESRD, may not present classic sepsis, add vanc, ceft, bolus volume, cvvhd, pct algo to dc abx at 48 hr if no infection noted, ensure [facility] sent The patient is critically ill with multiple organ systems failure and requires high complexity decision making for assessment and support, frequent evaluation and titration of therapies, application of advanced monitoring technologies and extensive interpretation of multiple databases.   Critical Care Time devoted to patient care services described in this note is30 Minutes. This time reflects time of care of this signee: Rory Percy, MD FACP. This critical care time does not reflect procedure time, or teaching time or supervisory time of PA/NP/Med student/Med Resident etc but could involve care discussion time. Rest per NP/medical resident whose note is outlined above and that I agree with   Mcarthur Rossetti. Tyson Alias, MD, FACP Pgr: [phone] [place] [facility] 07/14/2014 10:54 AM

## 2014-07-13 NOTE — Sedation Documentation (Signed)
PA and MD advise to take pt to ED to be worked up for admit.BP 77/24. Willl not do Declot procedure scheduled for now.

## 2014-07-14 ENCOUNTER — Inpatient Hospital Stay (HOSPITAL_COMMUNITY): Payer: Medicaid Other

## 2014-07-14 ENCOUNTER — Encounter (HOSPITAL_COMMUNITY): Payer: Self-pay

## 2014-07-14 ENCOUNTER — Other Ambulatory Visit (HOSPITAL_COMMUNITY): Payer: Medicaid Other

## 2014-07-14 DIAGNOSIS — T82590A Other mechanical complication of surgically created arteriovenous fistula, initial encounter: Secondary | ICD-10-CM | POA: Insufficient documentation

## 2014-07-14 DIAGNOSIS — D631 Anemia in chronic kidney disease: Secondary | ICD-10-CM

## 2014-07-14 DIAGNOSIS — E785 Hyperlipidemia, unspecified: Secondary | ICD-10-CM

## 2014-07-14 DIAGNOSIS — T8249XA Other complication of vascular dialysis catheter, initial encounter: Secondary | ICD-10-CM

## 2014-07-14 DIAGNOSIS — N186 End stage renal disease: Secondary | ICD-10-CM

## 2014-07-14 DIAGNOSIS — I959 Hypotension, unspecified: Secondary | ICD-10-CM

## 2014-07-14 DIAGNOSIS — N189 Chronic kidney disease, unspecified: Secondary | ICD-10-CM

## 2014-07-14 LAB — RENAL FUNCTION PANEL
ALBUMIN: 3.2 g/dL — AB (ref 3.5–5.2)
ANION GAP: 20 — AB (ref 5–15)
ANION GAP: 10 (ref 5–15)
Albumin: 2.9 g/dL — ABNORMAL LOW (ref 3.5–5.2)
BUN: 149 mg/dL — AB (ref 6–23)
BUN: 82 mg/dL — AB (ref 6–23)
CHLORIDE: 100 mmol/L (ref 96–112)
CHLORIDE: 104 mmol/L (ref 96–112)
CO2: 16 mmol/L — ABNORMAL LOW (ref 19–32)
CO2: 19 mmol/L (ref 19–32)
CREATININE: 19.58 mg/dL — AB (ref 0.50–1.35)
Calcium: 8.5 mg/dL (ref 8.4–10.5)
Calcium: 8.3 mg/dL — ABNORMAL LOW (ref 8.4–10.5)
Creatinine, Ser: 11.88 mg/dL — ABNORMAL HIGH (ref 0.50–1.35)
GFR calc Af Amer: 3 mL/min — ABNORMAL LOW (ref >90)
GFR calc Af Amer: 5 mL/min — ABNORMAL LOW (ref >90)
GFR calc non Af Amer: 2 mL/min — ABNORMAL LOW (ref >90)
GFR calc non Af Amer: 4 mL/min — ABNORMAL LOW (ref >90)
GLUCOSE: 95 mg/dL (ref 70–99)
GLUCOSE: 195 mg/dL — AB (ref 70–99)
POTASSIUM: 3.6 mmol/L (ref 3.5–5.1)
Phosphorus: 9.4 mg/dL — ABNORMAL HIGH (ref 2.3–4.6)
Phosphorus: 5.7 mg/dL — ABNORMAL HIGH (ref 2.3–4.6)
Potassium: 3.3 mmol/L — ABNORMAL LOW (ref 3.5–5.1)
Sodium: 136 mmol/L (ref 135–145)
Sodium: 133 mmol/L — ABNORMAL LOW (ref 135–145)

## 2014-07-14 LAB — TSH: TSH: 0.567 u[IU]/mL (ref 0.350–4.500)

## 2014-07-14 LAB — LIPID PANEL
CHOL/HDL RATIO: 3.1 ratio
Cholesterol: 94 mg/dL (ref 0–200)
HDL: 30 mg/dL — ABNORMAL LOW (ref >39)
LDL CALC: 33 mg/dL (ref 0–99)
TRIGLYCERIDES: 157 mg/dL — AB (ref <150)
VLDL: 31 mg/dL (ref 0–40)

## 2014-07-14 LAB — BASIC METABOLIC PANEL
Anion gap: 18 — ABNORMAL HIGH (ref 5–15)
BUN: 149 mg/dL — ABNORMAL HIGH (ref 6–23)
CHLORIDE: 100 mmol/L (ref 96–112)
CO2: 16 mmol/L — AB (ref 19–32)
Calcium: 8.4 mg/dL (ref 8.4–10.5)
Creatinine, Ser: 19.54 mg/dL — ABNORMAL HIGH (ref 0.50–1.35)
GFR calc Af Amer: 3 mL/min — ABNORMAL LOW (ref >90)
GFR calc non Af Amer: 2 mL/min — ABNORMAL LOW (ref >90)
GLUCOSE: 95 mg/dL (ref 70–99)
POTASSIUM: 3.6 mmol/L (ref 3.5–5.1)
Sodium: 134 mmol/L — ABNORMAL LOW (ref 135–145)

## 2014-07-14 LAB — GLUCOSE, CAPILLARY
GLUCOSE-CAPILLARY: 116 mg/dL — AB (ref 70–99)
GLUCOSE-CAPILLARY: 120 mg/dL — AB (ref 70–99)
GLUCOSE-CAPILLARY: 123 mg/dL — AB (ref 70–99)
GLUCOSE-CAPILLARY: 197 mg/dL — AB (ref 70–99)
GLUCOSE-CAPILLARY: 220 mg/dL — AB (ref 70–99)
GLUCOSE-CAPILLARY: 86 mg/dL (ref 70–99)

## 2014-07-14 LAB — LIPASE, BLOOD
Lipase: 399 U/L — ABNORMAL HIGH (ref 11–59)
Lipase: 97 U/L — ABNORMAL HIGH (ref 11–59)

## 2014-07-14 LAB — IRON AND TIBC
IRON: 158 ug/dL — AB (ref 42–165)
Saturation Ratios: 86 % — ABNORMAL HIGH (ref 20–55)
TIBC: 184 ug/dL — ABNORMAL LOW (ref 215–435)
UIBC: 26 ug/dL — AB (ref 125–400)

## 2014-07-14 LAB — CBC
HCT: 21.9 % — ABNORMAL LOW (ref 39.0–52.0)
Hemoglobin: 8 g/dL — ABNORMAL LOW (ref 13.0–17.0)
MCH: 31 pg (ref 26.0–34.0)
MCHC: 36.5 g/dL — ABNORMAL HIGH (ref 30.0–36.0)
MCV: 84.9 fL (ref 78.0–100.0)
PLATELETS: 134 10*3/uL — AB (ref 150–400)
RBC: 2.58 MIL/uL — AB (ref 4.22–5.81)
RDW: 13.4 % (ref 11.5–15.5)
WBC: 6.5 10*3/uL (ref 4.0–10.5)

## 2014-07-14 LAB — PROCALCITONIN: Procalcitonin: 1.75 ng/mL

## 2014-07-14 LAB — TROPONIN I
Troponin I: 0.03 ng/mL (ref <0.031)
Troponin I: 0.03 ng/mL (ref <0.031)
Troponin I: 0.04 ng/mL — ABNORMAL HIGH (ref <0.031)

## 2014-07-14 LAB — FERRITIN: Ferritin: 939 ng/mL — ABNORMAL HIGH (ref 22–322)

## 2014-07-14 MED ORDER — HEPARIN SODIUM (PORCINE) 5000 UNIT/ML IJ SOLN
5000.0000 [IU] | Freq: Three times a day (TID) | INTRAMUSCULAR | Status: DC
  Filled 2014-07-14 (×3): qty 1

## 2014-07-14 MED ORDER — SODIUM CHLORIDE 0.9 % IJ SOLN: 3.0000 mL | INTRAMUSCULAR | Status: DC | PRN

## 2014-07-14 MED ORDER — SODIUM CHLORIDE 0.9 % IV SOLN
250.0000 mL | INTRAVENOUS | Status: DC | PRN
  Administered 2014-07-14: 250 mL via INTRAVENOUS
  Administered 2014-07-17 (×2): via INTRAVENOUS

## 2014-07-14 MED ORDER — ACETAMINOPHEN 325 MG PO TABS
650.0000 mg | ORAL_TABLET | Freq: Four times a day (QID) | ORAL | Status: DC | PRN
  Administered 2014-07-15 – 2014-07-16 (×3): 650 mg via ORAL
  Filled 2014-07-14 (×3): qty 2

## 2014-07-14 MED ORDER — LABETALOL HCL 5 MG/ML IV SOLN
10.0000 mg | INTRAVENOUS | Status: DC | PRN
  Administered 2014-07-14 – 2014-07-16 (×4): 20 mg via INTRAVENOUS
  Filled 2014-07-14 (×6): qty 4

## 2014-07-14 MED ORDER — GUAIFENESIN-DM 100-10 MG/5ML PO SYRP
5.0000 mL | ORAL_SOLUTION | ORAL | Status: DC | PRN
  Filled 2014-07-14: qty 5

## 2014-07-14 MED ORDER — SODIUM CHLORIDE 0.9 % IJ SOLN
3.0000 mL | Freq: Two times a day (BID) | INTRAMUSCULAR | Status: DC
  Administered 2014-07-14 – 2014-07-18 (×6): 3 mL via INTRAVENOUS

## 2014-07-14 MED ORDER — CHLORHEXIDINE GLUCONATE 0.12 % MT SOLN
15.0000 mL | Freq: Two times a day (BID) | OROMUCOSAL | Status: DC
  Administered 2014-07-14 (×2): 15 mL via OROMUCOSAL
  Filled 2014-07-14: qty 15

## 2014-07-14 MED ORDER — SODIUM CHLORIDE 0.9 % IV SOLN
1500.0000 mg | Freq: Once | INTRAVENOUS | Status: AC
  Administered 2014-07-14: 1500 mg via INTRAVENOUS
  Filled 2014-07-14: qty 1500

## 2014-07-14 MED ORDER — CETYLPYRIDINIUM CHLORIDE 0.05 % MT LIQD: 7.0000 mL | Freq: Two times a day (BID) | OROMUCOSAL | Status: DC

## 2014-07-14 MED ORDER — INSULIN ASPART 100 UNIT/ML ~~LOC~~ SOLN: 0.0000 [IU] | Freq: Three times a day (TID) | SUBCUTANEOUS | Status: DC

## 2014-07-14 MED ORDER — DEXTROSE 5 % IV SOLN
2.0000 g | Freq: Two times a day (BID) | INTRAVENOUS | Status: DC
  Administered 2014-07-14: 2 g via INTRAVENOUS
  Filled 2014-07-14 (×2): qty 2

## 2014-07-14 MED ORDER — ALBUTEROL SULFATE (2.5 MG/3ML) 0.083% IN NEBU: 2.5000 mg | INHALATION_SOLUTION | RESPIRATORY_TRACT | Status: DC | PRN

## 2014-07-14 MED ORDER — FENTANYL CITRATE 0.05 MG/ML IJ SOLN
12.5000 ug | INTRAMUSCULAR | Status: DC | PRN
  Administered 2014-07-14: 50 ug via INTRAVENOUS
  Administered 2014-07-14 (×4): 25 ug via INTRAVENOUS
  Filled 2014-07-14 (×4): qty 2

## 2014-07-14 MED ORDER — HEPARIN SODIUM (PORCINE) 5000 UNIT/ML IJ SOLN: 5000.0000 [IU] | Freq: Three times a day (TID) | INTRAMUSCULAR | Status: DC

## 2014-07-14 MED ORDER — VANCOMYCIN HCL IN DEXTROSE 1-5 GM/200ML-% IV SOLN
1000.0000 mg | INTRAVENOUS | Status: DC
  Filled 2014-07-14: qty 200

## 2014-07-14 MED ORDER — ONDANSETRON HCL 4 MG PO TABS: 4.0000 mg | ORAL_TABLET | Freq: Four times a day (QID) | ORAL | Status: DC | PRN

## 2014-07-14 MED ORDER — HEPARIN SODIUM (PORCINE) 5000 UNIT/ML IJ SOLN
5000.0000 [IU] | Freq: Three times a day (TID) | INTRAMUSCULAR | Status: DC
  Administered 2014-07-14 – 2014-07-17 (×5): 5000 [IU] via SUBCUTANEOUS
  Filled 2014-07-14 (×12): qty 1

## 2014-07-14 MED ORDER — VANCOMYCIN HCL IN DEXTROSE 750-5 MG/150ML-% IV SOLN: 750.0000 mg | INTRAVENOUS | Status: DC

## 2014-07-14 MED ORDER — ONDANSETRON HCL 4 MG/2ML IJ SOLN: 4.0000 mg | Freq: Four times a day (QID) | INTRAMUSCULAR | Status: DC | PRN

## 2014-07-14 MED ORDER — INSULIN ASPART 100 UNIT/ML ~~LOC~~ SOLN
0.0000 [IU] | SUBCUTANEOUS | Status: DC
  Administered 2014-07-14 (×2): 2 [IU] via SUBCUTANEOUS
  Administered 2014-07-14: 1 [IU] via SUBCUTANEOUS
  Administered 2014-07-15: 2 [IU] via SUBCUTANEOUS
  Administered 2014-07-15: 1 [IU] via SUBCUTANEOUS
  Administered 2014-07-16: 2 [IU] via SUBCUTANEOUS
  Administered 2014-07-16: 3 [IU] via SUBCUTANEOUS
  Administered 2014-07-17 – 2014-07-18 (×3): 2 [IU] via SUBCUTANEOUS

## 2014-07-14 MED ORDER — ACETAMINOPHEN 650 MG RE SUPP
650.0000 mg | Freq: Four times a day (QID) | RECTAL | Status: DC | PRN
Start: 2014-07-14 — End: 2014-07-18

## 2014-07-14 MED ORDER — DEXTROSE 5 % IV SOLN
2.0000 g | INTRAVENOUS | Status: DC
  Filled 2014-07-14: qty 2

## 2014-07-14 MED ORDER — SODIUM CHLORIDE 0.9 % IJ SOLN: 3.0000 mL | Freq: Two times a day (BID) | INTRAMUSCULAR | Status: DC

## 2014-07-14 MED ORDER — LABETALOL HCL 5 MG/ML IV SOLN
10.0000 mg | INTRAVENOUS | Status: DC | PRN
  Administered 2014-07-14: 10 mg via INTRAVENOUS
  Filled 2014-07-14: qty 4

## 2014-07-14 MED ORDER — LABETALOL HCL 5 MG/ML IV SOLN
10.0000 mg | INTRAVENOUS | Status: DC | PRN
  Administered 2014-07-14: 10 mg via INTRAVENOUS

## 2014-07-14 NOTE — Progress Notes (Signed)
Renal panel results reported to Dr. Marisue HumbleSanford, Renal MD. Verbal order to discontinue CRRT. Plan to have pt receive hemodialysis tomorrow. Will continue to monitor.

## 2014-07-14 NOTE — Progress Notes (Signed)
Dr. Tyson AliasFeinstein made aware of pt's blood pressure sustaining systolics in the 170s-180s. Verbal order for 10 mg of labetolol every 4 hours as needed for systolic greater than 150. Will continue to monitor.

## 2014-07-14 NOTE — Procedures (Signed)
Arterial Catheter Insertion Procedure Note Theodore Hurley [id number] Sep 19, 1960  Procedure: Insertion of Arterial Catheter  Indications: Blood pressure monitoring and Frequent blood sampling  Procedure Details Consent: Risks of procedure as well as the alternatives and risks of each were explained to the (patient/caregiver).  Consent for procedure obtained. Time Out: Verified patient identification, verified procedure, site/side was marked, verified correct patient position, special equipment/implants available, medications/allergies/relevent history reviewed, required imaging and test results available.  Performed  Maximum sterile technique was used including antiseptics, cap, gloves, gown, hand hygiene, mask and sheet. Skin prep: Chlorhexidine; local anesthetic administered 20 gauge catheter was inserted into right femoral artery using the Seldinger technique.  Evaluation Blood flow good; BP tracing good. Complications: No apparent complications.  Procedure performed under direct ultrasound guidance for real time vessel cannulation.      Rutherford Guysahul Desai, [place] Sidonie Dickens- C [place] [facility] Pager: [phone]  or [phone] 07/14/2014, 12:36 AM    Levy Pupaobert Mishell Donalson, MD, PhD 07/22/2014, 9:14 AM [place] [facility] [phone] or if no answer [phone]

## 2014-07-14 NOTE — Procedures (Signed)
Hemodialysis Catheter Insertion Procedure Note Lenon AhmadiMiguel L Gargus [id number] April 24, 1961  Procedure: Insertion of Hemodialysis Catheter Indications: CRRT  Procedure Details Consent: Risks of procedure as well as the alternatives and risks of each were explained to the (patient/caregiver).  Consent for procedure obtained. Time Out: Verified patient identification, verified procedure, site/side was marked, verified correct patient position, special equipment/implants available, medications/allergies/relevent history reviewed, required imaging and test results available.  Performed  Maximum sterile technique was used including antiseptics, cap, gloves, gown, hand hygiene, mask and sheet. Skin prep: Chlorhexidine; local anesthetic administered A antimicrobial bonded/coated triple lumen catheter was placed in the right femoral vein due to patient being a dialysis patient using the Seldinger technique.  Evaluation Blood flow good Complications: No apparent complications Patient did tolerate procedure well. Chest X-ray ordered to verify placement.  CXR: Not needed.  Procedure performed under direct ultrasound guidance for real time vessel cannulation.      Rutherford Guysahul Desai, [place] Sidonie Dickens- C [place] [facility] Pgr: [phone]  or [phone] 07/14/2014, 12:36 AM   Levy Pupaobert Sereena Marando, MD, PhD 07/22/2014, 9:14 AM [place] [facility] [phone] or if no answer [phone]

## 2014-07-14 NOTE — Progress Notes (Signed)
ANTIBIOTIC CONSULT NOTE - FOLLOW UP  Pharmacy Consult for Ceftazidime + Vancomycin Indication: r/o vascular infection  No Known Allergies  Patient Measurements: Height: 5\' 3"  (160 cm) Weight: 188 lb 0.8 oz (85.3 kg) IBW/kg (Calculated) : 56.9  Vital Signs: Temp: 98.1 F (36.7 C) (02/23 1930) Temp Source: Oral (02/23 1930) BP: 159/94 mmHg (02/23 1900) Pulse Rate: 70 (02/23 1900) Intake/Output from previous day: 02/22 0701 - 02/23 0700 In: 0  Out: 13  Intake/Output from this shift:    Labs:  Recent Labs  07/13/14 1617 07/14/14 0400 07/14/14 1600  WBC 7.5 6.5  --   HGB 8.7* 8.0*  --   PLT 124* 134*  --   CREATININE 27.10* 19.54*  19.58* 11.88*   Estimated Creatinine Clearance: 7 mL/min (by C-G formula based on Cr of 11.88). No results for input(s): VANCOTROUGH, VANCOPEAK, VANCORANDOM, GENTTROUGH, GENTPEAK, GENTRANDOM, TOBRATROUGH, TOBRAPEAK, TOBRARND, AMIKACINPEAK, AMIKACINTROU, AMIKACIN in the last 72 hours.   Microbiology: Recent Results (from the past 720 hour(s))  MRSA PCR Screening     Status: None   Collection Time: 07/13/14 10:14 PM  Result Value Ref Range Status   MRSA by PCR NEGATIVE NEGATIVE Final    Comment:        The GeneXpert MRSA Assay (FDA approved for NASAL specimens only), is one component of a comprehensive MRSA colonization surveillance program. It is not intended to diagnose MRSA infection nor to guide or monitor treatment for MRSA infections.     Anti-infectives    Start     Dose/Rate Route Frequency Ordered Stop   07/15/14 1115  vancomycin (VANCOCIN) IVPB 750 mg/150 ml premix     750 mg 150 mL/hr over 60 Minutes Intravenous Every 24 hours 07/14/14 1108     07/14/14 1115  vancomycin (VANCOCIN) 1,500 mg in sodium chloride 0.9 % 500 mL IVPB     1,500 mg 250 mL/hr over 120 Minutes Intravenous  Once 07/14/14 1108 07/14/14 1515   07/14/14 1115  cefTAZidime (FORTAZ) 2 g in dextrose 5 % 50 mL IVPB     2 g 100 mL/hr over 30 Minutes  Intravenous Every 12 hours 07/14/14 1108        Assessment: 53 YOM with ESRD started on CVVHDF on 2/22 in the setting of hypotension while awaiting resolution of clotted AVF vs new HD access. CRRT was discontinued this evening as the patient is no longer hypotension and renal panel acceptable per nephrology - per RN note, plan is for HD tomorrow. Will adjust antibiotics now that CRRT has been stopped.   Goal of Therapy:  Vancomycin level of 15-25 mcg/ml  Plan:  1. Adjust Vancomycin to 1g post HD on MWF 2. Adjust Ceftazidime to 2g post HD on MWF 3. Will continue to follow HD schedule/duration, culture results, LOT, and antibiotic de-escalation plans   Georgina PillionElizabeth Emanuele Mcwhirter, PharmD, BCPS Clinical Pharmacist Pager: [phone] 07/14/2014 7:37 PM

## 2014-07-14 NOTE — Consult Note (Signed)
Chief Complaint: Chief Complaint  Patient presents with  . Hypotension  ESRD Clotted Rt arm fistula  Referring Physician(s): Dr Lowell GuitarPowell  History of Present Illness: Theodore Hurley is a 54 y.o. male   [facility] declot of Rt arm dialysis fistula Last use 1 week earlier---worked well; clotted on Wed In RN station noted to be severly hypotensive; Nausea Hardly able to follow commands Sent to ED for evaluation Now admitted to ICU Emergent Rt groin temporary dialysis catheter placement last pm-- dialysis now Metabolic acidosis; hypotension Request made from Dr Lowell GuitarPowell for Rt arm fistula thrombolysis with possible angioplasty/stent. Possible tunneled dialysis catheter if needed I have seen and examined pt Plan for procedure possible 2/24----dependent on pt status tomorrow   Past Medical History  Diagnosis Date  . Diabetes mellitus without complication   . Hypertension   . Stroke     slight left side weakness  . Chronic kidney disease     ESRD Dialysis MWF  . Dyslipidemia     Past Surgical History  Procedure Laterality Date  . Cholecystectomy    . Av fistula placement Right     Allergies: Review of patient's allergies indicates no known allergies.  Medications: Prior to Admission medications   Medication Sig Start Date End Date Taking? Authorizing Provider  acetaminophen (TYLENOL) 500 MG tablet Take 500 mg by mouth every 6 (six) hours as needed.   Yes Historical Provider, MD  amLODipine (NORVASC) 10 MG tablet Take 10 mg by mouth daily.   Yes Historical Provider, MD  aspirin EC 81 MG tablet Take 81 mg by mouth daily.   Yes Historical Provider, MD  Capsaicin 0.035 % CREA Apply 1 application topically 3 (three) times daily as needed (for sore areas).   Yes Historical Provider, MD  cinacalcet (SENSIPAR) 90 MG tablet Take 90 mg by mouth daily.   Yes Historical Provider, MD  glipiZIDE (GLUCOTROL) 10 MG tablet Take 10 mg by mouth  daily before breakfast.   Yes Historical Provider, MD  lanthanum (FOSRENOL) 1000 MG chewable tablet Chew 1,000-2,000 mg by mouth as directed. 2000mg  with meals, 1000mg  with snacks   Yes Historical Provider, MD  lisinopril (PRINIVIL,ZESTRIL) 20 MG tablet Take 20 mg by mouth daily.   Yes Historical Provider, MD  simvastatin (ZOCOR) 10 MG tablet Take 10 mg by mouth daily.   Yes Historical Provider, MD     History reviewed. No pertinent family history.  History   Social History  . Marital Status: Married    Spouse Name: N/A  . Number of Children: N/A  . Years of Education: N/A   Social History Main Topics  . Smoking status: Never Smoker   . Smokeless tobacco: Not on file  . Alcohol Use: Not on file  . Drug Use: Not on file  . Sexual Activity: Not on file   Other Topics Concern  . None   Social History Narrative    Review of Systems: A 12 point ROS discussed and pertinent positives are indicated in the HPI above.  All other systems are negative.  Review of Systems  Constitutional: Positive for activity change and fatigue. Negative for fever.  Respiratory: Positive for shortness of breath.   Cardiovascular: Negative for chest pain.  Gastrointestinal: Positive for nausea and vomiting.  Neurological: Positive for weakness.  Psychiatric/Behavioral: Negative for behavioral problems and confusion.     Vital Signs: BP 145/71 mmHg  Pulse 85  Temp(Src) 97.4 F (36.3  C) (Oral)  Resp 11  Ht  (1.6 m)  Wt 85.3 kg (188 lb 0.8 oz)  BMI 33.32 kg/m2  SpO2 100%  Physical Exam  Constitutional: He is oriented to person, place, and time. He appears well-nourished.  Cardiovascular: Normal rate, regular rhythm and normal heart sounds.   No murmur heard. Pulmonary/Chest: Effort normal. He has wheezes.  Abdominal: Soft. Bowel sounds are normal. There is no tenderness.  Musculoskeletal: Normal range of motion.  Rt lowe arm dialysis fistula: no thrill no pulse  Neurological: He is  alert and oriented to person, place, and time.  Skin: Skin is warm and dry.  Psychiatric: He has a normal mood and affect. His behavior is normal. Judgment and thought content normal.  Nursing note and vitals reviewed.   Mallampati Score:  MD Evaluation Airway: WNL Heart: WNL Abdomen: WNL Chest/ Lungs: WNL ASA  Classification: 3 Mallampati/Airway Score: Two  Imaging: Dg Chest 2 View  07/13/2014   CLINICAL DATA:  Weakness and hypotension for 2 days  EXAM: CHEST  2 VIEW  COMPARISON:  July 13, 2014 study obtained earlier in the day  FINDINGS: There is no edema or consolidation. Heart is mildly enlarged with pulmonary vascularity within normal limits. No adenopathy. There is postoperative change in the lower cervical spine.  IMPRESSION: Mild cardiac enlargement.  No edema or consolidation.   Electronically Signed   By: Bretta Bang III M.D.   On: 07/13/2014 16:19   Dg Abd Portable 1v  07/14/2014   CLINICAL DATA:  Right femoral line placement.  EXAM: PORTABLE ABDOMEN - 1 VIEW  COMPARISON:  None.  FINDINGS: Tip of the presumed right femoral catheter is to the right of L5, in the region of the common iliac vein. There is air throughout normal caliber colon. No dilated bowel loops to suggest obstruction. Surgical clips in the right upper quadrant of the abdomen likely from cholecystectomy.  IMPRESSION: Tip of the right femoral catheter in the region of the right common iliac vein.   Electronically Signed   By: Rubye Oaks M.D.   On: 07/14/2014 01:59    Labs:  CBC:  Recent Labs  07/13/14 1617 07/14/14 0400  WBC 7.5 6.5  HGB 8.7* 8.0*  HCT 24.6* 21.9*  PLT 124* 134*    COAGS: No results for input(s): INR, APTT in the last 8760 hours.  BMP:  Recent Labs  07/13/14 1617 07/14/14 0400  NA 130* 134*  136  K 5.8* 3.6  3.6  CL 97 100  100  CO2 7* 16*  16*  GLUCOSE 220* 95  95  BUN 184* 149*  149*  CALCIUM 9.0 8.4  8.5  CREATININE 27.10* 19.54*  19.58*    GFRNONAA 2* 2*  2*  GFRAA 2* 3*  3*    LIVER FUNCTION TESTS:  Recent Labs  07/13/14 1617 07/14/14 0400  BILITOT 0.5  --   AST 10  --   ALT 21  --   ALKPHOS 99  --   PROT 6.9  --   ALBUMIN 3.4* 3.2*    TUMOR MARKERS: No results for input(s): AFPTM, CEA, CA199, CHROMGRNA in the last 8760 hours.  Assessment and Plan:  Clotted Rt lower arm dialysis fistula  New metabolic acidosis and hypotension In ICU now---dialyzing now through Rt temp femoral cath Scheduled for thrombolysis of Rt arm dialysis fistula- and possible pta/stent placement. Possible tunneled HD catheter placement if needed Plan for procedure probable 2/24 Pt and family aware of procedure  benefits and risks including but not limited to Infection; bleeding; PE; damage to fistula Agreeable to proceed Consent signed andin chart Ancef ordered  Thank you for this interesting consult.  I greatly enjoyed meeting Theodore Hurley and look forward to participating in their care.  Signed: Georgia Delsignore A 07/14/2014, 1:14 PM   I spent a total of 20 Minutes  in face to face in clinical consultation, greater than 50% of which was counseling/coordinating care for Rt arm dialysis fistula declot/pta/stent.  Possible dialysis catheter placement

## 2014-07-14 NOTE — Progress Notes (Signed)
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 .   Hyperkalemia, resolved  Metabolic acidosis, improving  Hypotension Instability most worrisome for underlying sepsis/ ischemia  Plan will cont CRRT until new access.  WIll ask IR to declot and if unsuccessful proceed with tunneled catheter.  I worry about recirculation in AV access as cause for poor dialysis  Subjective: Interval History: on CRRT  Objective: Vital signs in last 24 hours: Temp:  [97.2 F (36.2 C)-97.7 F (36.5 C)] 97.4 F (36.3 C) (02/23 0811) Pulse Rate:  [59-77] 77 (02/23 0800) Resp:  [8-20] 13 (02/23 0800) BP: (77-165)/(14-97) 160/70 mmHg (02/23 0800) SpO2:  [92 %-100 %] 100 % (02/23 0800) Arterial Line BP: (114-164)/(39-56) 164/56 mmHg (02/23 0800) Weight:  [85 kg (187 lb 6.3 oz)-85.5 kg (188 lb 7.9 oz)] 85.3 kg (188 lb 0.8 oz) (02/23 0452) Weight change:   Intake/Output from previous day: 02/22 0701 - 02/23 0700 In: 0  Out: 13  Intake/Output this shift:    Chest wall: no tenderness GI: soft, non-tender; bowel sounds normal; no masses,  no organomegaly Extremities: extremities normal, atraumatic, no cyanosis or edema  Lab Results:  Recent Labs  07/13/14 1617 07/14/14 0400  WBC 7.5 6.5  HGB 8.7* 8.0*  HCT 24.6* 21.9*  PLT 124* 134*   BMET:  Recent Labs  07/13/14 1617 07/14/14 0400  NA 130* 134*  136  K 5.8* 3.6  3.6  CL 97 100  100  CO2 7* 16*  16*  GLUCOSE 220* 95  95  BUN 184* 149*  149*  CREATININE 27.10* 19.54*  19.58*  CALCIUM 9.0 8.4  8.5   No results for input(s): PTH in the last 72 hours. Iron Studies: No results for input(s): IRON, TIBC, TRANSFERRIN, FERRITIN in the last 72 hours. Studies/Results: Dg Chest 2 View  07/13/2014   CLINICAL DATA:  Weakness and hypotension for 2 days  EXAM: CHEST  2 VIEW  COMPARISON:  July 13, 2014 study obtained earlier in the day  FINDINGS:  There is no edema or consolidation. Heart is mildly enlarged with pulmonary vascularity within normal limits. No adenopathy. There is postoperative change in the lower cervical spine.  IMPRESSION: Mild cardiac enlargement.  No edema or consolidation.   Electronically Signed   By: Bretta BangWilliam  Woodruff III M.D.   On: 07/13/2014 16:19   Dg Abd Portable 1v  07/14/2014   CLINICAL DATA:  Right femoral line placement.  EXAM: PORTABLE ABDOMEN - 1 VIEW  COMPARISON:  None.  FINDINGS: Tip of the presumed right femoral catheter is to the right of L5, in the region of the common iliac vein. There is air throughout normal caliber colon. No dilated bowel loops to suggest obstruction. Surgical clips in the right upper quadrant of the abdomen likely from cholecystectomy.  IMPRESSION: Tip of the right femoral catheter in the region of the right common iliac vein.   Electronically Signed   By: Rubye OaksMelanie  Ehinger M.D.   On: 07/14/2014 01:59   Scheduled: . antiseptic oral rinse  7 mL Mouth Rinse q12n4p  . chlorhexidine  15 mL Mouth Rinse BID  . heparin subcutaneous  5,000 Units Subcutaneous 3 times per day  . insulin aspart  0-9 Units Subcutaneous 6 times per day  . midodrine  10 mg Oral TID WC  . sodium chloride  3 mL Intravenous Q12H      LOS: 1 day   Fabio Wah C 07/14/2014,8:56 AM

## 2014-07-14 NOTE — Progress Notes (Signed)
UR Completed.  [phone]  

## 2014-07-14 NOTE — Progress Notes (Signed)
ANTIBIOTIC CONSULT NOTE - INITIAL  Pharmacy Consult for vancomycin, Elita QuickFortaz Indication: r/o vascular infection  No Known Allergies  Patient Measurements: Height: 5\' 3"  (160 cm) Weight: 188 lb 0.8 oz (85.3 kg) IBW/kg (Calculated) : 56.9  Vital Signs: Temp: 97.4 F (36.3 C) (02/23 0811) Temp Source: Oral (02/23 0811) BP: 145/71 mmHg (02/23 1300) Pulse Rate: 85 (02/23 1300) Intake/Output from previous day: 02/22 0701 - 02/23 0700 In: 0  Out: 13  Intake/Output from this shift: Total I/O In: 81.2 [I.V.:31.2; IV Piggyback:50] Out: 116 [Other:116]  Labs:  Recent Labs  07/13/14 1617 07/14/14 0400  WBC 7.5 6.5  HGB 8.7* 8.0*  PLT 124* 134*  CREATININE 27.10* 19.54*  19.58*   Estimated Creatinine Clearance: 4.3 mL/min (by C-G formula based on Cr of 19.58). No results for input(s): VANCOTROUGH, VANCOPEAK, VANCORANDOM, GENTTROUGH, GENTPEAK, GENTRANDOM, TOBRATROUGH, TOBRAPEAK, TOBRARND, AMIKACINPEAK, AMIKACINTROU, AMIKACIN in the last 72 hours.   Microbiology: Recent Results (from the past 720 hour(s))  MRSA PCR Screening     Status: None   Collection Time: 07/13/14 10:14 PM  Result Value Ref Range Status   MRSA by PCR NEGATIVE NEGATIVE Final    Comment:        The GeneXpert MRSA Assay (FDA approved for NASAL specimens only), is one component of a comprehensive MRSA colonization surveillance program. It is not intended to diagnose MRSA infection nor to guide or monitor treatment for MRSA infections.     Medical History: Past Medical History  Diagnosis Date  . Diabetes mellitus without complication   . Hypertension   . Stroke     slight left side weakness  . Chronic kidney disease     ESRD Dialysis MWF  . Dyslipidemia    Assessment: 54 year old male with ESRD admitted from IR suite after became hypotensive while trying to declot his fistula. Antibiotics to start for r/o sepsis from possible vascular infection. WBC is within normal limits. Afebrile but on CRRT  initiated 2/22 PM. MRSA PCR (-). Cultures being drawn.   Goal of Therapy:  Vancomycin trough level 15-20 mcg/ml  Clinical resolution of infection  Plan:  Vancomycin 1.5g IV now then 750mg  IV every 24 hours while on CRRT. Fortaz 2g IV every 12 hours while on CRRT. Follow-up plans for CRRT vs HD and adjust therapy as needed.  Follow-up culture results and narrow therapy as able.  Link SnufferJessica Carylon Tamburro, PharmD, BCPS Clinical Pharmacist [phone] 07/14/2014,1:13 PM

## 2014-07-15 ENCOUNTER — Inpatient Hospital Stay (HOSPITAL_COMMUNITY): Payer: Medicaid Other

## 2014-07-15 DIAGNOSIS — N186 End stage renal disease: Secondary | ICD-10-CM

## 2014-07-15 DIAGNOSIS — T82898A Other specified complication of vascular prosthetic devices, implants and grafts, initial encounter: Secondary | ICD-10-CM

## 2014-07-15 DIAGNOSIS — I950 Idiopathic hypotension: Secondary | ICD-10-CM

## 2014-07-15 LAB — PROTIME-INR
INR: 1.18 (ref 0.00–1.49)
PROTHROMBIN TIME: 15.2 s (ref 11.6–15.2)

## 2014-07-15 LAB — PREPARE RBC (CROSSMATCH)

## 2014-07-15 LAB — ABO/RH: ABO/RH(D): O POS

## 2014-07-15 LAB — RENAL FUNCTION PANEL
Albumin: 2.7 g/dL — ABNORMAL LOW (ref 3.5–5.2)
Anion gap: 14 (ref 5–15)
BUN: 71 mg/dL — AB (ref 6–23)
CALCIUM: 8.5 mg/dL (ref 8.4–10.5)
CO2: 20 mmol/L (ref 19–32)
Chloride: 101 mmol/L (ref 96–112)
Creatinine, Ser: 11.88 mg/dL — ABNORMAL HIGH (ref 0.50–1.35)
GFR calc non Af Amer: 4 mL/min — ABNORMAL LOW (ref >90)
GFR, EST AFRICAN AMERICAN: 5 mL/min — AB (ref >90)
GLUCOSE: 111 mg/dL — AB (ref 70–99)
Phosphorus: 7.1 mg/dL — ABNORMAL HIGH (ref 2.3–4.6)
Potassium: 3.6 mmol/L (ref 3.5–5.1)
SODIUM: 135 mmol/L (ref 135–145)

## 2014-07-15 LAB — CBC
HCT: 18.5 % — ABNORMAL LOW (ref 39.0–52.0)
Hemoglobin: 6.6 g/dL — CL (ref 13.0–17.0)
MCH: 30.7 pg (ref 26.0–34.0)
MCHC: 35.7 g/dL (ref 30.0–36.0)
MCV: 86 fL (ref 78.0–100.0)
PLATELETS: 130 10*3/uL — AB (ref 150–400)
RBC: 2.15 MIL/uL — AB (ref 4.22–5.81)
RDW: 13.2 % (ref 11.5–15.5)
WBC: 5.2 10*3/uL (ref 4.0–10.5)

## 2014-07-15 LAB — GLUCOSE, CAPILLARY
GLUCOSE-CAPILLARY: 107 mg/dL — AB (ref 70–99)
GLUCOSE-CAPILLARY: 142 mg/dL — AB (ref 70–99)
GLUCOSE-CAPILLARY: 151 mg/dL — AB (ref 70–99)
Glucose-Capillary: 117 mg/dL — ABNORMAL HIGH (ref 70–99)
Glucose-Capillary: 123 mg/dL — ABNORMAL HIGH (ref 70–99)
Glucose-Capillary: 200 mg/dL — ABNORMAL HIGH (ref 70–99)
Glucose-Capillary: 96 mg/dL (ref 70–99)
Glucose-Capillary: 97 mg/dL (ref 70–99)

## 2014-07-15 LAB — TROPONIN I: Troponin I: 0.05 ng/mL — ABNORMAL HIGH (ref <0.031)

## 2014-07-15 LAB — PROCALCITONIN: Procalcitonin: 2.46 ng/mL

## 2014-07-15 LAB — MAGNESIUM: Magnesium: 2.2 mg/dL (ref 1.5–2.5)

## 2014-07-15 LAB — APTT: APTT: 37 s (ref 24–37)

## 2014-07-15 MED ORDER — FENTANYL CITRATE 0.05 MG/ML IJ SOLN
INTRAMUSCULAR | Status: AC
  Filled 2014-07-15: qty 4

## 2014-07-15 MED ORDER — AMLODIPINE BESYLATE 10 MG PO TABS
10.0000 mg | ORAL_TABLET | Freq: Every day | ORAL | Status: DC
  Administered 2014-07-15 – 2014-07-18 (×4): 10 mg via ORAL
  Filled 2014-07-15 (×4): qty 1

## 2014-07-15 MED ORDER — MIDAZOLAM HCL 2 MG/2ML IJ SOLN
INTRAMUSCULAR | Status: DC | PRN
  Administered 2014-07-15: 1 mg via INTRAVENOUS

## 2014-07-15 MED ORDER — MIDAZOLAM HCL 2 MG/2ML IJ SOLN
INTRAMUSCULAR | Status: AC | PRN
  Administered 2014-07-15: 1 mg via INTRAVENOUS

## 2014-07-15 MED ORDER — MIDAZOLAM HCL 2 MG/2ML IJ SOLN
INTRAMUSCULAR | Status: AC
  Filled 2014-07-15: qty 4

## 2014-07-15 MED ORDER — ALTEPLASE 100 MG IV SOLR: 2.0000 mg | Freq: Once | INTRAVENOUS | Status: DC

## 2014-07-15 MED ORDER — ALTEPLASE 100 MG IV SOLR
4.0000 mg | Freq: Once | INTRAVENOUS | Status: AC
  Administered 2014-07-15: 2 mg
  Filled 2014-07-15: qty 4

## 2014-07-15 MED ORDER — LIDOCAINE HCL 1 % IJ SOLN
INTRAMUSCULAR | Status: AC
  Filled 2014-07-15: qty 20

## 2014-07-15 MED ORDER — HEPARIN SODIUM (PORCINE) 1000 UNIT/ML IJ SOLN
INTRAMUSCULAR | Status: AC
  Administered 2014-07-15: 3000 [IU]
  Filled 2014-07-15: qty 1

## 2014-07-15 MED ORDER — SODIUM CHLORIDE 0.9 % IV SOLN: Freq: Once | INTRAVENOUS | Status: DC

## 2014-07-15 MED ORDER — IOHEXOL 300 MG/ML  SOLN
100.0000 mL | Freq: Once | INTRAMUSCULAR | Status: AC | PRN
  Administered 2014-07-15: 30 mL via INTRAVENOUS

## 2014-07-15 MED ORDER — FENTANYL CITRATE 0.05 MG/ML IJ SOLN
INTRAMUSCULAR | Status: DC | PRN
  Administered 2014-07-15: 50 ug via INTRAVENOUS

## 2014-07-15 MED ORDER — CEFAZOLIN SODIUM-DEXTROSE 2-3 GM-% IV SOLR
INTRAVENOUS | Status: AC
  Administered 2014-07-15: 2000 mg
  Filled 2014-07-15: qty 50

## 2014-07-15 MED ORDER — FENTANYL CITRATE 0.05 MG/ML IJ SOLN
INTRAMUSCULAR | Status: AC | PRN
  Administered 2014-07-15: 50 ug via INTRAVENOUS

## 2014-07-15 MED ORDER — LISINOPRIL 20 MG PO TABS
20.0000 mg | ORAL_TABLET | Freq: Every day | ORAL | Status: DC
  Administered 2014-07-15 – 2014-07-18 (×4): 20 mg via ORAL
  Filled 2014-07-15 (×4): qty 1

## 2014-07-15 MED ORDER — ACETAMINOPHEN 325 MG PO TABS
ORAL_TABLET | ORAL | Status: AC
  Filled 2014-07-15: qty 2

## 2014-07-15 NOTE — Sedation Documentation (Signed)
Patient denies pain and is resting comfortably.  

## 2014-07-15 NOTE — Consult Note (Signed)
Vascular Surgery Consultation  Reason for Consult: End-stage renal disease with clotted right arm AV fistula  HPI: Theodore Hurley is a 54 y.o. male who presents for evaluation of needs vascular access. Patient has been on hemodialysis for 14 years. Initially he had left arm access and eventually occluded left upper arm graft. He has had a right arm fistula for 5+ years. It has not been functioning well recently. He had attempted thrombolyze his today which was unsuccessful. 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 Patient has never had lower extremity access. He does have a temporary catheter in the right femoral vein.   Past Medical History  Diagnosis Date  . Diabetes mellitus without complication   . Hypertension   . Stroke     slight left side weakness  . Chronic kidney disease     ESRD Dialysis MWF  . Dyslipidemia    Past Surgical History  Procedure Laterality Date  . Cholecystectomy    . Av fistula placement Right    History   Social History  . Marital Status: Married    Spouse Name: N/A  . Number of Children: N/A  . Years of Education: N/A   Social History Main Topics  . Smoking status: Never Smoker   . Smokeless tobacco: Not on file  . Alcohol Use: Not on file  . Drug Use: Not on file  . Sexual Activity: Not on file   Other Topics Concern  . None   Social History Narrative   History reviewed. No pertinent family history. No Known Allergies Prior to Admission medications   Medication Sig Start Date End Date Taking? Authorizing Provider  acetaminophen (TYLENOL) 500 MG tablet Take 500 mg by mouth every 6 (six) hours as needed.   Yes Historical Provider, MD  amLODipine (NORVASC) 10 MG tablet Take 10 mg by mouth daily.   Yes Historical Provider, MD  aspirin EC 81 MG tablet Take 81 mg by mouth daily.   Yes Historical Provider, MD   Capsaicin 0.035 % CREA Apply 1 application topically 3 (three) times daily as needed (for sore areas).   Yes Historical Provider, MD  cinacalcet (SENSIPAR) 90 MG tablet Take 90 mg by mouth daily.   Yes Historical Provider, MD  glipiZIDE (GLUCOTROL) 10 MG tablet Take 10 mg by mouth daily before breakfast.   Yes Historical Provider, MD  lanthanum (FOSRENOL) 1000 MG chewable tablet Chew 1,000-2,000 mg by mouth as directed.  with meals,  with snacks   Yes Historical Provider, MD  lisinopril (PRINIVIL,ZESTRIL) 20 MG tablet Take 20 mg by mouth daily.   Yes Historical Provider, MD  simvastatin (ZOCOR) 10 MG tablet Take 10 mg by mouth daily.   Yes Historical Provider, MD     Positive ROS: Denies chest pain but does have dyspnea on exertion due to recent fluid overload due to inadequate hemodialysis. 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 Also history of hypertension.  All other systems have been reviewed and were otherwise negative with the exception of those mentioned in the HPI and as above.  Physical Exam: Filed Vitals:   07/15/14 2000  BP: 120/62  Pulse: 67  Temp:   Resp: 10    General: Alert, no acute distress HEENT: Normal for age Cardiovascular:  Regular rate and rhythm. Carotid pulses 2+, no bruits audible Respiratory: Clear to auscultation. No cyanosis, no use of accessory musculature GI: No organomegaly, abdomen is soft and non-tender Skin: No lesions in the area of chief complaint Neurologic: Sensation intact distally Psychiatric: Patient is competent for consent with normal mood and affect Musculoskeletal: No obvious deformities Extremities: Right leg with catheter in femoral vein and 3+ popliteal pulse palpable. Right foot well-perfused Left leg with 3+ femoral and popliteal pulse palpable left foot well-perfused. Right upper extremity AV fistula in forearm  which is pulseless.  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 This was treated with balloon angioplasty and a tunneled catheter was passed through this area for dialysis purposes.   Assessment/Plan:  Patient needs left thigh AV graft for hemodialysis Insertion of any further access in the right arm would likely fail or lead to severe edema because of central vein stenosis and indwelling catheter through this area Patient is currently receiving hemodialysis this evening  Plan insertion left thigh AV graft on Friday morning at 9 AM Patient will either need repeat hemodialysis on Thursday afternoon or evening or following insertion of thigh graft on Friday. Discussed this with patient and he is agreeable to proceed   [name], MD 07/15/2014 8:07 PM

## 2014-07-15 NOTE — Progress Notes (Signed)
eLink Physician-Brief Progress Note Patient Name: Lenon AhmadiMiguel L Cryderman DOB: 05/09/1961 MRN: [id number]   Date of Service  07/15/2014  HPI/Events of Note  Hgb 8.0 > 6.6  eICU Interventions  Transfuse one unit PRBCs Further eval per rounding MD     Intervention Category Intermediate Interventions: Other:  Billy FischerDavid Bertin Inabinet 07/15/2014, 5:27 AM

## 2014-07-15 NOTE — Procedures (Signed)
Interventional Radiology Procedure Note  Procedure: 1.) Attempted declot with PTA of subclavian stenosis to 10 mm.  Unsuccessful 2/2 narrowing of the inflow artery which is atretic.  2.) Placement of a Right subclavian approach tunneled HD catheter  Complications: None Recommendations: - Surgical consult for right forearm AVF revision  Signed,  Sterling BigHeath K. McCullough, MD

## 2014-07-15 NOTE — Progress Notes (Signed)
Pt transferred to 6E01 from hemodialysis. Report received from ICU and hemodialysis nurses. Telemetry box 6E02 applied, CCMD notified, pt running normal sinus. Assessment completed, pt oriented to unit and unit staff. Pt lying comfortably in bed with no needs stated at this time. Will continue to monitor.  Feliciana RossettiLaura Jalan Fariss, RN

## 2014-07-15 NOTE — Progress Notes (Signed)
[facility]OTOC[facility] FOR AM LAB REPLACEMENT ONLY  The patient does not apply for the [facility] Electrolyte Replacment Protocol based on the criteria listed below:     Is BUN < 60 mg/dL? No.  Patient's BUN today is 71  Abnormal electrolyte(s) 71  f a panic level lab has been reported, has the CCM MD in charge been notified? Yes.  .   Physician:  Robbie Lis Simonds, MD  Melrose NakayamaChisholm, Eann Cleland William 07/15/2014 5:06 AM

## 2014-07-15 NOTE — Progress Notes (Signed)
PULMONARY / CRITICAL CARE MEDICINE   Name: Theodore AhmadiMiguel L Hurley MRN: [id number] DOB: 09-23-1960    ADMISSION DATE:  07/13/2014 CONSULTATION DATE:  07/15/2014  REFERRING MD :  Hyman HopesWebb  CHIEF COMPLAINT:  Hypotension, ESRD  INITIAL PRESENTATION:  54 y.o. M w ESRD (M/W/F - last full dialysis Mon 2/15) who was in IR suite 2/22 for declotting of fistula; however, was hypotensive so sent to ED for further evaluation.  Seen by nephrology who recommended ICU admission and initiation of CRRT.  STUDIES:  CXR 2/22 >>> no acute process  SIGNIFICANT EVENTS: 2/22 - admit; CRRT 2/23 - CRRT stopped; Vanc/Ceftaz started; BP elevated - prn labetalol   SUBJECTIVE: Continue to feel tired and complain of some pain at HD cath site unchanged from yesterday.   VITAL SIGNS: Temp:  [98.1 F (36.7 C)-99 F (37.2 C)] 98.5 F (36.9 C) (02/24 1000) Pulse Rate:  [63-90] 63 (02/24 1000) Resp:  [10-29] 17 (02/24 1000) BP: (128-196)/(55-94) 164/77 mmHg (02/24 1000) SpO2:  [99 %-100 %] 100 % (02/24 1000) Arterial Line BP: (141-189)/(41-63) 158/48 mmHg (02/24 1000) Weight:  [188 lb 0.8 oz (85.3 kg)] 188 lb 0.8 oz (85.3 kg) (02/24 0413) HEMODYNAMICS:   VENTILATOR SETTINGS:   INTAKE / OUTPUT: Intake/Output      02/23 0701 - 02/24 0700 02/24 0701 - 02/25 0700   P.O. 370    I.V. (mL/kg) 191.2 (2.2) 30 (0.4)   Blood  305   Other 30    IV Piggyback 550    Total Intake(mL/kg) 1141.2 (13.4) 335 (3.9)   Urine (mL/kg/hr)     Other 935 (0.5)    Stool 0 (0)    Total Output 935     Net +206.2 +335        Stool Occurrence 1 x 1 x     PHYSICAL EXAMINATION: General: Hispanic male, in NAD, speaks little english. Neuro: A&O x 3, non-focal.  HEENT: [place]/AT. PERRL, sclerae anicteric. Cardiovascular: RRR, no M/R/G.  Lungs: Respirations even and unlabored.  CTA bilaterally, No W/R/R. Abdomen: BS x 4, soft, ND.  Generalized tenderness with palpation. Musculoskeletal: No gross deformities, no edema.  Skin: Intact, warm, no  rashes.  LABS:  CBC  Recent Labs Lab 07/13/14 1617 07/14/14 0400 07/15/14 0420  WBC 7.5 6.5 5.2  HGB 8.7* 8.0* 6.6*  HCT 24.6* 21.9* 18.5*  PLT 124* 134* 130*   Coag's  Recent Labs Lab 07/15/14 0420  APTT 37  INR 1.18   BMET  Recent Labs Lab 07/14/14 0400 07/14/14 1600 07/15/14 0420  NA 134*  136 133* 135  K 3.6  3.6 3.3* 3.6  CL 100  100 104 101  CO2 16*  16* 19 20  BUN 149*  149* 82* 71*  CREATININE 19.54*  19.58* 11.88* 11.88*  GLUCOSE 95  95 195* 111*   Electrolytes  Recent Labs Lab 07/14/14 0400 07/14/14 1600 07/15/14 0420  CALCIUM 8.4  8.5 8.3* 8.5  MG  --   --  2.2  PHOS 9.4* 5.7* 7.1*   Sepsis Markers  Recent Labs Lab 07/13/14 1843 07/14/14 1200 07/15/14 0420  LATICACIDVEN 0.67  --   --   PROCALCITON  --  1.75 2.46   ABG No results for input(s): PHART, PCO2ART, PO2ART in the last 168 hours. Liver Enzymes  Recent Labs Lab 07/13/14 1617 07/14/14 0400 07/14/14 1600 07/15/14 0420  AST 10  --   --   --   ALT 21  --   --   --  ALKPHOS 99  --   --   --   BILITOT 0.5  --   --   --   ALBUMIN 3.4* 3.2* 2.9* 2.7*   Cardiac Enzymes  Recent Labs Lab 07/14/14 1610 07/14/14 2246 07/15/14 0420  TROPONINI 0.03 0.04* 0.05*   Glucose  Recent Labs Lab 07/14/14 0424 07/14/14 0808 07/14/14 1152 07/14/14 1555 07/15/14 0416 07/15/14 0804  GLUCAP 86 116* 120* 197* 107* 117*    Imaging Dg Chest 2 View  07/13/2014   CLINICAL DATA:  Weakness and hypotension for 2 days  EXAM: CHEST  2 VIEW  COMPARISON:  July 13, 2014 study obtained earlier in the day  FINDINGS: There is no edema or consolidation. Heart is mildly enlarged with pulmonary vascularity within normal limits. No adenopathy. There is postoperative change in the lower cervical spine.  IMPRESSION: Mild cardiac enlargement.  No edema or consolidation.   Electronically Signed   By: Bretta Bang III M.D.   On: 07/13/2014 16:19   Dg Chest Port 1 View  07/15/2014    CLINICAL DATA:  Preoperative exam prior to declining procedure for right arm dialysis fistula  EXAM: PORTABLE CHEST - 1 VIEW  COMPARISON:  PA and lateral chest x-ray of July 13, 2014  FINDINGS: The lungs are reasonably well inflated and clear. The cardiac silhouette is top-normal in size. The pulmonary vascularity is within the limits of normal. There is no pleural effusion. The mediastinum is mildly widened though stable. The trachea is midline. The bony thorax is unremarkable.  IMPRESSION: Mild stable enlargement of the cardiac silhouette. There is no active cardiopulmonary disease.   Electronically Signed   By: David  [place]   On: 07/15/2014 07:30   Dg Abd Portable 1v  07/14/2014   CLINICAL DATA:  Right femoral line placement.  EXAM: PORTABLE ABDOMEN - 1 VIEW  COMPARISON:  None.  FINDINGS: Tip of the presumed right femoral catheter is to the right of L5, in the region of the common iliac vein. There is air throughout normal caliber colon. No dilated bowel loops to suggest obstruction. Surgical clips in the right upper quadrant of the abdomen likely from cholecystectomy.  IMPRESSION: Tip of the right femoral catheter in the region of the right common iliac vein.   Electronically Signed   By: Rubye Oaks M.D.   On: 07/14/2014 01:59    ASSESSMENT / PLAN:  PULMONARY A: No acute process P:   Pulmonary hygiene. pcxr unimpressive for overlaod  CARDIOVASCULAR R Fem HD cath 2/22 >>> R Fem A line 2/22 >>>2/24 A:  Hypotension -  Has resolved after gentle hydration. Hx HTN, HLD P:  Monitor hemodynamics - BP elevated getting prn labetalol  Holding outpatient amlodipine, lisinopril, simvastatin. Trop mild elevation: 0.05  Tsh wnl  Dc aline Start oral home HTn meds  RENAL A:   ESRD on dialysis M/W/F Uremia AG metabolic acidosis - due to uremia / renal failure- Resolved Hyponatremia - Resolved Mild hyperkalemia - received kayxelate in ED P:   Nephrology following - CRRT stopped 2/23;  Plan for IHD 2/24 IR following: Scheduled for thrombolysis of Rt arm dialysis fistula- and possible pta/stent placement on 2/24 Lactate wnl  BMP in AM.  GASTROINTESTINAL A:   ? Pancreatitis - lipase 105 + generalized abd pain - hypoperfusion likley Nutrition P:   Repeat lipase improved 97 (2/23) < 399  Follow stool NPO, feed after procedure  HEMATOLOGIC A:   Anemia  Esrd, r/o losses Thrombocytopenia VTE Prophylaxis P:  Transfuse  per usual ICU guidelines; s/p 1u pRBC 2/24 Monitor platelets. SCD's / Heparin.(no bleeding noted) epo per renal CBC in AM. iron panel, ferr, tibc - No iron def guaice stools  INFECTIOUS A:   AT RISK for vascula infection, infected clot P:   Monitor clinically. Pct 2.46 Low threshold for empiric ABX, ceftaz, vanc (will dose) This is the patient group that fly under radar and difficult to detect sepsis BCx NGTD Abx Vanc/Ceftaz 2/23 (1pm) >>; Day 2/x   pcxr neg, no fevers, clinical course HTn fast, dc all ABX Pct unimpressive for ESRD as well  ENDOCRINE A:   DM  P:   CBG's q4hr. SSI. Hold outpatient glipizide.  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 Low dose fentanyl PRN.   Family updated: None.  Interdisciplinary Family Meeting v Palliative Care Meeting:  Due by: 2/29.  Jamal Collin, MD 07/15/2014, 10:41 AM PGY-2, [facility]  STAFF NOTE: I, Rory Percy, MD FACP have personally reviewed patient's available data, including medical history, events of note, physical examination and test results as part of my evaluation. I have discussed with resident/NP and other care providers such as pharmacist, RN and RRT. In addition, I personally evaluated patient and elicited key findings of: no evidence infection, dc abx, assess anemia, guaic, to declot, 1 unit prbc, to med floor, triad, no distress, CTA, pcxr wnl   Mcarthur Rossetti. Tyson Alias, MD, FACP Pgr:  [phone] [facility] 07/15/2014 10:58 AM  ]

## 2014-07-15 NOTE — Progress Notes (Signed)
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 .   Anemia  Plan Hemodialysis today. Will give another unit of blood in anticipation of surgery.  I notified [facility] of need for surgical intervention.  Subjective: Interval History: Failed declot  Objective: Vital signs in last 24 hours: Temp:  [98.1 F (36.7 C)-99 F (37.2 C)] 98.4 F (36.9 C) (02/24 1220) Pulse Rate:  [60-77] 60 (02/24 1535) Resp:  [8-29] 10 (02/24 1623) BP: (128-196)/(55-94) 162/85 mmHg (02/24 1623) SpO2:  [97 %-100 %] 100 % (02/24 1535) Arterial Line BP: (141-174)/(41-63) 171/53 mmHg (02/24 1100) Weight:  [85.3 kg (188 lb 0.8 oz)] 85.3 kg (188 lb 0.8 oz) (02/24 0413) Weight change: 0.3 kg (10.6 oz)  Intake/Output from previous day: 02/23 0701 - 02/24 0700 In: 1141.2 [P.O.:370; I.V.:191.2; IV Piggyback:550] Out: 935  Intake/Output this shift: Total I/O In: 355 [I.V.:50; Blood:305] Out: -   Head: Normocephalic, without obvious abnormality, atraumatic Resp: clear to auscultation bilaterally and right chest IJ Chest wall: no tenderness Cardio: regular rate and rhythm, S1, S2 normal, no murmur, click, rub or gallop Extremities: extremities normal, atraumatic, no cyanosis or edema and clotted RUE AVA  Lab Results:  Recent Labs  07/14/14 0400 07/15/14 0420  WBC 6.5 5.2  HGB 8.0* 6.6*  HCT 21.9* 18.5*  PLT 134* 130*   BMET:  Recent Labs  07/14/14 1600 07/15/14 0420  NA 133* 135  K 3.3* 3.6  CL 104 101  CO2 19 20  GLUCOSE 195* 111*  BUN 82* 71*  CREATININE 11.88* 11.88*  CALCIUM 8.3* 8.5   No results for input(s): PTH in the last 72 hours. Iron Studies:  Recent Labs  07/14/14 1057  IRON 158*  TIBC 184*  FERRITIN 939*   Studies/Results: Dg Chest Port 1 View  07/15/2014   CLINICAL DATA:  Preoperative exam prior to declining procedure for right arm dialysis fistula  EXAM: PORTABLE CHEST - 1 VIEW   COMPARISON:  PA and lateral chest x-ray of July 13, 2014  FINDINGS: The lungs are reasonably well inflated and clear. The cardiac silhouette is top-normal in size. The pulmonary vascularity is within the limits of normal. There is no pleural effusion. The mediastinum is mildly widened though stable. The trachea is midline. The bony thorax is unremarkable.  IMPRESSION: Mild stable enlargement of the cardiac silhouette. There is no active cardiopulmonary disease.   Electronically Signed   By: David  [place]   On: 07/15/2014 07:30   Dg Abd Portable 1v  07/14/2014   CLINICAL DATA:  Right femoral line placement.  EXAM: PORTABLE ABDOMEN - 1 VIEW  COMPARISON:  None.  FINDINGS: Tip of the presumed right femoral catheter is to the right of L5, in the region of the common iliac vein. There is air throughout normal caliber colon. No dilated bowel loops to suggest obstruction. Surgical clips in the right upper quadrant of the abdomen likely from cholecystectomy.  IMPRESSION: Tip of the right femoral catheter in the region of the right common iliac vein.   Electronically Signed   By: Rubye OaksMelanie  Ehinger M.D.   On: 07/14/2014 01:59   Scheduled: . sodium chloride   Intravenous Once  . amLODipine  10 mg Oral Daily  . fentaNYL      . heparin subcutaneous  5,000 Units Subcutaneous 3 times per day  . insulin aspart  0-9 Units Subcutaneous 6 times per day  . lidocaine      .  lidocaine      . lisinopril  20 mg Oral Daily  . midazolam      . sodium chloride  3 mL Intravenous Q12H     LOS: 2 days   Briceyda Abdullah C 07/15/2014,4:48 PM

## 2014-07-16 ENCOUNTER — Ambulatory Visit (HOSPITAL_COMMUNITY): Admission: RE | Admit: 2014-07-16 | Payer: Medicaid Other | Source: Ambulatory Visit | Admitting: Vascular Surgery

## 2014-07-16 ENCOUNTER — Encounter (HOSPITAL_COMMUNITY): Admission: RE | Payer: Self-pay | Source: Ambulatory Visit

## 2014-07-16 LAB — TYPE AND SCREEN
ABO/RH(D): O POS
ANTIBODY SCREEN: NEGATIVE
UNIT DIVISION: 0
Unit division: 0

## 2014-07-16 LAB — OCCULT BLOOD X 1 CARD TO LAB, STOOL: Fecal Occult Bld: POSITIVE — AB

## 2014-07-16 LAB — GLUCOSE, CAPILLARY
GLUCOSE-CAPILLARY: 181 mg/dL — AB (ref 70–99)
Glucose-Capillary: 108 mg/dL — ABNORMAL HIGH (ref 70–99)
Glucose-Capillary: 104 mg/dL — ABNORMAL HIGH (ref 70–99)
Glucose-Capillary: 121 mg/dL — ABNORMAL HIGH (ref 70–99)
Glucose-Capillary: 237 mg/dL — ABNORMAL HIGH (ref 70–99)

## 2014-07-16 LAB — CBC
HCT: 26.5 % — ABNORMAL LOW (ref 39.0–52.0)
Hemoglobin: 9.1 g/dL — ABNORMAL LOW (ref 13.0–17.0)
MCH: 29.8 pg (ref 26.0–34.0)
MCHC: 34.3 g/dL (ref 30.0–36.0)
MCV: 86.9 fL (ref 78.0–100.0)
PLATELETS: 141 10*3/uL — AB (ref 150–400)
RBC: 3.05 MIL/uL — ABNORMAL LOW (ref 4.22–5.81)
RDW: 13.8 % (ref 11.5–15.5)
WBC: 5.8 10*3/uL (ref 4.0–10.5)

## 2014-07-16 LAB — CLOSTRIDIUM DIFFICILE BY PCR: Toxigenic C. Difficile by PCR: NEGATIVE

## 2014-07-16 LAB — HEPATITIS B SURFACE ANTIGEN: Hepatitis B Surface Ag: NEGATIVE

## 2014-07-16 SURGERY — ASSESSMENT, SHUNT FUNCTION, WITH CONTRAST RADIOGRAPHIC STUDY

## 2014-07-16 SURGERY — ASSESSMENT, SHUNT FUNCTION, WITH CONTRAST RADIOGRAPHIC STUDY
Laterality: Right

## 2014-07-16 MED ORDER — SODIUM CHLORIDE 0.9 % IV SOLN: 100.0000 mL | INTRAVENOUS | Status: DC | PRN

## 2014-07-16 MED ORDER — PENTAFLUOROPROP-TETRAFLUOROETH EX AERO: 1.0000 "application " | INHALATION_SPRAY | CUTANEOUS | Status: DC | PRN

## 2014-07-16 MED ORDER — LIDOCAINE HCL (PF) 1 % IJ SOLN: 5.0000 mL | INTRAMUSCULAR | Status: DC | PRN

## 2014-07-16 MED ORDER — DIPHENOXYLATE-ATROPINE 2.5-0.025 MG/5ML PO LIQD: 5.0000 mL | Freq: Four times a day (QID) | ORAL | Status: DC | PRN

## 2014-07-16 MED ORDER — CEFAZOLIN SODIUM-DEXTROSE 2-3 GM-% IV SOLR
2.0000 g | INTRAVENOUS | Status: DC
  Filled 2014-07-16: qty 50

## 2014-07-16 MED ORDER — NEPRO/CARBSTEADY PO LIQD: 237.0000 mL | ORAL | Status: DC | PRN

## 2014-07-16 MED ORDER — ALTEPLASE 2 MG IJ SOLR
2.0000 mg | Freq: Once | INTRAMUSCULAR | Status: AC | PRN
  Filled 2014-07-16: qty 2

## 2014-07-16 MED ORDER — PANTOPRAZOLE SODIUM 40 MG PO TBEC
40.0000 mg | DELAYED_RELEASE_TABLET | Freq: Every day | ORAL | Status: DC
  Administered 2014-07-16 – 2014-07-18 (×2): 40 mg via ORAL
  Filled 2014-07-16: qty 1

## 2014-07-16 MED ORDER — LOPERAMIDE HCL 2 MG PO CAPS
2.0000 mg | ORAL_CAPSULE | ORAL | Status: DC | PRN
  Administered 2014-07-16: 2 mg via ORAL
  Filled 2014-07-16 (×2): qty 1

## 2014-07-16 MED ORDER — HEPARIN SODIUM (PORCINE) 1000 UNIT/ML DIALYSIS: 20.0000 [IU]/kg | INTRAMUSCULAR | Status: DC | PRN

## 2014-07-16 MED ORDER — LIDOCAINE-PRILOCAINE 2.5-2.5 % EX CREA: 1.0000 "application " | TOPICAL_CREAM | CUTANEOUS | Status: DC | PRN

## 2014-07-16 MED ORDER — DEXTROSE 5 % IV SOLN
1.5000 g | INTRAVENOUS | Status: AC
  Administered 2014-07-17: 1.5 g via INTRAVENOUS
  Filled 2014-07-16: qty 1.5

## 2014-07-16 MED ORDER — CEFAZOLIN SODIUM-DEXTROSE 2-3 GM-% IV SOLR
2.0000 g | INTRAVENOUS | Status: DC
Start: 2014-07-16 — End: 2014-07-16

## 2014-07-16 MED ORDER — HEPARIN SODIUM (PORCINE) 1000 UNIT/ML DIALYSIS: 1000.0000 [IU] | INTRAMUSCULAR | Status: DC | PRN

## 2014-07-16 MED ORDER — DIPHENOXYLATE-ATROPINE 2.5-0.025 MG PO TABS
1.0000 | ORAL_TABLET | Freq: Four times a day (QID) | ORAL | Status: DC | PRN
  Administered 2014-07-16 (×2): 1 via ORAL
  Filled 2014-07-16 (×2): qty 1

## 2014-07-16 NOTE — Progress Notes (Addendum)
Internal medicine/hospitalist progress note     Name: Theodore Hurley MRN: [id number] DOB: 1960-05-23    ADMISSION DATE:  07/13/2014 CONSULTATION DATE:  07/16/2014  REFERRING MD :  Hyman Hopes  CHIEF COMPLAINT:  Hypotension, ESRD  TR[facility] assumed care 2/25  INITIAL PRESENTATION:  54 y.o. M w ESRD (M/W/F - last full dialysis Mon 2/15) who was in IR suite 2/22 for declotting of fistula; however, was hypotensive so sent to ED for further evaluation.  Seen by nephrology who recommended ICU admission and initiation of CRRT.  Subjective-patient mainly complaining about diarrhea, had 3 bowel movements today, C. difficile negative, otherwise feels well   STUDIES:  CXR 2/22 >>> no acute process  SIGNIFICANT EVENTS: 2/22 - admit; CRRT 2/23 - CRRT stopped; Vanc/Ceftaz started; BP elevated - prn labetalol     VITAL SIGNS: Temp:  [97.4 F (36.3 C)-99.3 F (37.4 C)] 98.6 F (37 C) (02/25 0917) Pulse Rate:  [60-73] 70 (02/25 0917) Resp:  [8-20] 17 (02/25 0917) BP: (118-188)/(60-85) 156/66 mmHg (02/25 0917) SpO2:  [100 %] 100 % (02/25 0917) Weight:  [83.6 kg (184 lb 4.9 oz)-84.5 kg (186 lb 4.6 oz)] 83.825 kg (184 lb 12.8 oz) (02/24 2224) HEMODYNAMICS:   VENTILATOR SETTINGS:   INTAKE / OUTPUT:   PHYSICAL EXAMINATION: General: Hispanic male, in NAD, speaks little english. Neuro: A&O x 3, non-focal.  HEENT: [place]/AT. PERRL, sclerae anicteric. Cardiovascular: RRR, no M/R/G.  Lungs: Respirations even and unlabored.  CTA bilaterally, No W/R/R. Abdomen: BS x 4, soft, ND.  Generalized tenderness with palpation. Musculoskeletal: No gross deformities, no edema.  Skin: Intact, warm, no rashes.  LABS:  CBC  Recent Labs Lab 07/14/14 0400 07/15/14 0420 07/15/14 2330  WBC 6.5 5.2 5.8  HGB 8.0* 6.6* 9.1*  HCT 21.9* 18.5* 26.5*  PLT 134* 130* 141*   Coag's  Recent Labs Lab 07/15/14 0420  APTT 37  INR 1.18   BMET  Recent Labs Lab 07/14/14 0400 07/14/14 1600 07/15/14 0420  NA 134*   136 133* 135  K 3.6  3.6 3.3* 3.6  CL 100  100 104 101  CO2 16*  16* 19 20  BUN 149*  149* 82* 71*  CREATININE 19.54*  19.58* 11.88* 11.88*  GLUCOSE 95  95 195* 111*   Electrolytes  Recent Labs Lab 07/14/14 0400 07/14/14 1600 07/15/14 0420  CALCIUM 8.4  8.5 8.3* 8.5  MG  --   --  2.2  PHOS 9.4* 5.7* 7.1*   Sepsis Markers  Recent Labs Lab 07/13/14 1843 07/14/14 1200 07/15/14 0420  LATICACIDVEN 0.67  --   --   PROCALCITON  --  1.75 2.46   ABG No results for input(s): PHART, PCO2ART, PO2ART in the last 168 hours. Liver Enzymes  Recent Labs Lab 07/13/14 1617 07/14/14 0400 07/14/14 1600 07/15/14 0420  AST 10  --   --   --   ALT 21  --   --   --   ALKPHOS 99  --   --   --   BILITOT 0.5  --   --   --   ALBUMIN 3.4* 3.2* 2.9* 2.7*   Cardiac Enzymes  Recent Labs Lab 07/14/14 1610 07/14/14 2246 07/15/14 0420  TROPONINI 0.03 0.04* 0.05*   Glucose  Recent Labs Lab 07/15/14 1559 07/15/14 2214 07/15/14 2355 07/16/14 0409 07/16/14 0738 07/16/14 1100  GLUCAP 123* 96 97 108* 104* 121*    Imaging Ir Pta Venous Right  07/15/2014   CLINICAL DATA:  54 year old male with  end-stage renal disease on hemodialysis. His right forearm  EXAM: IR RIGHT DECLOT; PTA VENOUS; ARTERIOVENOUS SHUNT FOR DIALYSIS; ADDITIONAL ACCESS; IR ULTRASOUND GUIDANCE VASC ACCESS RIGHT; IR RIGHT FLOURO GUIDE CV LINE  Date: 07/15/2014  PROCEDURE: 1. Ultrasound-guided antegrade puncture adjacent to the arterial anastomosis 2. Ultrasound-guided retrograde puncture 3. Catheterization of the right subclavian vein with central and pull-back venogram 4. 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 Pharmacomechanical thrombolysis/thrombectomy declot procedure 6. Attempted catheterization of the arterial inflow with limited arteriogram 7. Ultrasound-guided puncture of the right subclavian vein from a supraclavicular approach 8. Placement of a 19 cm tip to cuff tunneled hemodialysis catheter  Interventional Radiologist:  Sterling Big, MD  ANESTHESIA/SEDATION: Moderate (conscious) sedation was used. 2 mg Versed, 100 mcg Fentanyl were administered intravenously. The patient's vital signs were monitored continuously by radiology nursing throughout the procedure.  Sedation Time: 80 minutes  MEDICATIONS: 2 g Ancef and 3000 units heparin administered intravenously. Ancef was administered within 1 hours of skin incision.  FLUOROSCOPY TIME:  15 minutes 30 seconds  CONTRAST:  30mL OMNIPAQUE IOHEXOL 300 MG/ML  SOLN  TECHNIQUE: Informed consent was obtained from the patient following explanation of the procedure, risks, benefits and alternatives. The patient understands, agrees and consents for the procedure. All questions were addressed. A time out was performed.  Maximal barrier sterile technique utilized including caps, mask, sterile gowns, sterile gloves, large sterile drape, hand hygiene, and Betadine skin prep.  The right upper extremity arteriovenous fistula was interrogated with ultrasound. There is subocclusive clot throughout the main stick zone. The vein proximal and distal to this is patent and compressible. However, there is a very weak pulse suggesting possible inflow disease versus high-grade central stenosis.  Local anesthesia was attained by infiltration with 1% lidocaine. Under real-time sonographic guidance, the vein was punctured in antegrade fashion several cm from the arterial anastomosis. A transitional 5 French micro sheath was advanced over a micro wire into the stick zone. 2 mg of tPA was then injected into the thrombus. This was manually macerated by gentle massage while the outflow vein was capped compressed with the ultrasound probe. 3000 units of heparin was injected intravenously.  The transitional micro sheath was then exchanged over a Bentson wire for a working 7 [place] vascular sheath. A Kumpe the catheter was advanced into the right subclavian vein and a central venogram  was performed. 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 A pull-back venogram was then performed demonstrating patency of the cephalic arch and proximal cephalic vein.  A Rosen wire was next advanced into the superior vena cava and the subclavian vein was angioplasty to 10 mm using a 10 x 40 mm Conquest balloon. The inflation was maintained at 20 atmospheres with full effacement for 2 minutes. Following angioplasty, the balloon was removed. The 6 French Angiojet device was then inserted over the [place] wire and rheolytic thrombectomy/thrombolysis was performed throughout the region of the fistula with internal thrombus. The Angiojet was then removed and a 7 x 40 mm Conquest balloon was advanced through the sheath an used to further balloon macerate and angioplasty the stick zone and proximal draining vein.  Following this, aspiration was performed through the sheath. There was blood flow however it was extremely week. Therefore, using similar technique to above, ultrasound-guided puncture of the draining vein in retrograde fashion directed toward the arterial anastomosis was performed with a 21 gauge micropuncture needle. An image  was obtained and stored for the medical record. Using the transitional micro sheath, a working 6 [place] vascular sheath was advanced into the vein. A Kumpe the catheter and Glidewire were used to advanced into the arterial anastomosis. A gentle hand injection of contrast material demonstrates stasis within the inflow artery secondary to the 5 French catheter being occlusive. This was confirmed is the catheter was gently removed. Once back within the main draining vein the contrast column slowly washed out into the fistula. There is no distal flow which represents an interval change compared to October of 2014. Unfortunately, the inflow radial artery is extremely atretic. There is no outflow distal to the stenosis. The  vessel measures approximately 2 mm in diameter.  Attempts were made to gently catheterize the very small radial artery, however the 5 French catheter was occlusive and a wire could not make the 90 degree bend. Given the very poor arterial inflow, the chance of obtaining sustained patency of this fistula is near 0. Therefore, further intervention was aborted.  Attention was turned to the right neck for placement of a tunneled hemodialysis catheter.  The right neck was interrogated with ultrasound. The right internal jugular vein is atretic and thick walled and cannot be visualized directly to the subclavian vein. The subclavian vein and external jugular vein are slightly hyper trophic. 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 As the subclavian vein was easily visible from a supraclavicular approach, it was decided to place the catheter directly into this vessel. Local anesthesia was attained by infiltration with 1% lidocaine. A small dermatotomy was made. Under real-time sonographic guidance (an image was obtained and stored for the medical record) the vessels punctured with a 21 gauge micropuncture needle. With the assistance of a 5 French micro sheath, a 0.018 inch wire was advanced into the heart in the intravascular depth measured. The micro wire was then removed and a 0.035 inch wire was advanced through the heart and into the inferior vena cava.  A suitable skin exit site inferior to the clavicle was selected. Local anesthesia was attained by 1% lidocaine infiltration. A small dermatotomy was made. A 19 cm tip to cuff Bard hemo split hemodialysis catheter was then tunneled from the skin exit site to the dermatotomy overlying the venous access site. A peel-away sheath was then advanced over the Bentson wire and into the right heart. The tunneled hemodialysis catheter was then fed through the peel-away sheath and the tips positioned in the upper right atrium under fluoroscopic imaging. An image was  obtained and stored.  The catheter flushed and aspirated with ease and was locked with heparinized saline. The catheter was then secured to the skin with 0 Prolene suture and a sterile bandage. The dermatotomy over the venous access site was sealed with Dermabond. The patient tolerated the procedure well.  COMPLICATIONS: None  IMPRESSION: 1. 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 2. Unsuccessful declot procedure secondary to very poor arterial inflow from a small, atherosclerotic and atretic radial artery. 3. Successful placement of a right IJ approach 19 cm tip to cuff tunneled hemodialysis catheter. Catheter tips are in the upper right atrium and ready for immediate use.  PLAN: 1. Recommend referral to vascular surgery for revision of the right upper extremity arteriovenous fistula. Signed,  Sterling Big, MD  [facility]  [facility]   Electronically Signed   By: Malachy Moan M.D.   On: 07/15/2014 17:16   Ir Pta Venous  Right  07/15/2014   CLINICAL DATA:  54 year old male with end-stage renal disease on hemodialysis. His right forearm  EXAM: IR RIGHT DECLOT; PTA VENOUS; ARTERIOVENOUS SHUNT FOR DIALYSIS; ADDITIONAL ACCESS; IR ULTRASOUND GUIDANCE VASC ACCESS RIGHT; IR RIGHT FLOURO GUIDE CV LINE  Date: 07/15/2014  PROCEDURE: 1. Ultrasound-guided antegrade puncture adjacent to the arterial anastomosis 2. Ultrasound-guided retrograde puncture 3. Catheterization of the right subclavian vein with central and pull-back venogram 4. 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 Pharmacomechanical thrombolysis/thrombectomy declot procedure 6. Attempted catheterization of the arterial inflow with limited arteriogram 7. Ultrasound-guided puncture of the right subclavian vein from a supraclavicular approach 8. Placement of a 19 cm tip to cuff tunneled hemodialysis catheter Interventional Radiologist:  Sterling Big, MD   ANESTHESIA/SEDATION: Moderate (conscious) sedation was used. 2 mg Versed, 100 mcg Fentanyl were administered intravenously. The patient's vital signs were monitored continuously by radiology nursing throughout the procedure.  Sedation Time: 80 minutes  MEDICATIONS: 2 g Ancef and 3000 units heparin administered intravenously. Ancef was administered within 1 hours of skin incision.  FLUOROSCOPY TIME:  15 minutes 30 seconds  CONTRAST:  30mL OMNIPAQUE IOHEXOL 300 MG/ML  SOLN  TECHNIQUE: Informed consent was obtained from the patient following explanation of the procedure, risks, benefits and alternatives. The patient understands, agrees and consents for the procedure. All questions were addressed. A time out was performed.  Maximal barrier sterile technique utilized including caps, mask, sterile gowns, sterile gloves, large sterile drape, hand hygiene, and Betadine skin prep.  The right upper extremity arteriovenous fistula was interrogated with ultrasound. There is subocclusive clot throughout the main stick zone. The vein proximal and distal to this is patent and compressible. However, there is a very weak pulse suggesting possible inflow disease versus high-grade central stenosis.  Local anesthesia was attained by infiltration with 1% lidocaine. Under real-time sonographic guidance, the vein was punctured in antegrade fashion several cm from the arterial anastomosis. A transitional 5 French micro sheath was advanced over a micro wire into the stick zone. 2 mg of tPA was then injected into the thrombus. This was manually macerated by gentle massage while the outflow vein was capped compressed with the ultrasound probe. 3000 units of heparin was injected intravenously.  The transitional micro sheath was then exchanged over a Bentson wire for a working 7 [place] vascular sheath. A Kumpe the catheter was advanced into the right subclavian vein and a central venogram was performed. There appears to be a moderate to  high-grade stenosis of the subclavian vein as it passes under the clavicle. A pull-back venogram was then performed demonstrating patency of the cephalic arch and proximal cephalic vein.  A Rosen wire was next advanced into the superior vena cava and the subclavian vein was angioplasty to 10 mm using a 10 x 40 mm Conquest balloon. The inflation was maintained at 20 atmospheres with full effacement for 2 minutes. Following angioplasty, the balloon was removed. The 6 French Angiojet device was then inserted over the [place] wire and rheolytic thrombectomy/thrombolysis was performed throughout the region of the fistula with internal thrombus. The Angiojet was then removed and a 7 x 40 mm Conquest balloon was advanced through the sheath an used to further balloon macerate and angioplasty the stick zone and proximal draining vein.  Following this, aspiration was performed through the sheath. There was blood flow however it was extremely week. Therefore, using similar technique to above, ultrasound-guided puncture of the draining vein in retrograde fashion directed toward the arterial  anastomosis was performed with a 21 gauge micropuncture needle. An image was obtained and stored for the medical record. Using the transitional micro sheath, a working 6 [place] vascular sheath was advanced into the vein. A Kumpe the catheter and Glidewire were used to advanced into the arterial anastomosis. A gentle hand injection of contrast material demonstrates stasis within the inflow artery secondary to the 5 French catheter being occlusive. This was confirmed is the catheter was gently removed. Once back within the main draining vein the contrast column slowly washed out into the fistula. There is no distal flow which represents an interval change compared to October of 2014. Unfortunately, the inflow radial artery is extremely atretic. There is no outflow distal to the stenosis. The vessel measures approximately 2 mm in diameter.   Attempts were made to gently catheterize the very small radial artery, however the 5 French catheter was occlusive and a wire could not make the 90 degree bend. Given the very poor arterial inflow, the chance of obtaining sustained patency of this fistula is near 0. Therefore, further intervention was aborted.  Attention was turned to the right neck for placement of a tunneled hemodialysis catheter.  The right neck was interrogated with ultrasound. The right internal jugular vein is atretic and thick walled and cannot be visualized directly to the subclavian vein. The subclavian vein and external jugular vein are slightly hyper trophic. 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 As the subclavian vein was easily visible from a supraclavicular approach, it was decided to place the catheter directly into this vessel. Local anesthesia was attained by infiltration with 1% lidocaine. A small dermatotomy was made. Under real-time sonographic guidance (an image was obtained and stored for the medical record) the vessels punctured with a 21 gauge micropuncture needle. With the assistance of a 5 French micro sheath, a 0.018 inch wire was advanced into the heart in the intravascular depth measured. The micro wire was then removed and a 0.035 inch wire was advanced through the heart and into the inferior vena cava.  A suitable skin exit site inferior to the clavicle was selected. Local anesthesia was attained by 1% lidocaine infiltration. A small dermatotomy was made. A 19 cm tip to cuff Bard hemo split hemodialysis catheter was then tunneled from the skin exit site to the dermatotomy overlying the venous access site. A peel-away sheath was then advanced over the Bentson wire and into the right heart. The tunneled hemodialysis catheter was then fed through the peel-away sheath and the tips positioned in the upper right atrium under fluoroscopic imaging. An image was obtained and stored.  The catheter flushed and aspirated  with ease and was locked with heparinized saline. The catheter was then secured to the skin with 0 Prolene suture and a sterile bandage. The dermatotomy over the venous access site was sealed with Dermabond. The patient tolerated the procedure well.  COMPLICATIONS: None  IMPRESSION: 1. 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 2. Unsuccessful declot procedure secondary to very poor arterial inflow from a small, atherosclerotic and atretic radial artery. 3. Successful placement of a right IJ approach 19 cm tip to cuff tunneled hemodialysis catheter. Catheter tips are in the upper right atrium and ready for immediate use.  PLAN: 1. Recommend referral to vascular surgery for revision of the right upper extremity arteriovenous fistula. Signed,  Sterling BigHeath K. McCullough, MD  [facility]  [facility]   Electronically Signed   By: Malachy MoanHeath  McCullough  M.D.   On: 07/15/2014 17:16   Ir Fluoro Guide Cv Line Right  07/15/2014   CLINICAL DATA:  54 year old male with end-stage renal disease on hemodialysis. His right forearm  EXAM: IR RIGHT DECLOT; PTA VENOUS; ARTERIOVENOUS SHUNT FOR DIALYSIS; ADDITIONAL ACCESS; IR ULTRASOUND GUIDANCE VASC ACCESS RIGHT; IR RIGHT FLOURO GUIDE CV LINE  Date: 07/15/2014  PROCEDURE: 1. Ultrasound-guided antegrade puncture adjacent to the arterial anastomosis 2. Ultrasound-guided retrograde puncture 3. Catheterization of the right subclavian vein with central and pull-back venogram 4. 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 Pharmacomechanical thrombolysis/thrombectomy declot procedure 6. Attempted catheterization of the arterial inflow with limited arteriogram 7. Ultrasound-guided puncture of the right subclavian vein from a supraclavicular approach 8. Placement of a 19 cm tip to cuff tunneled hemodialysis catheter Interventional Radiologist:  Sterling Big, MD  ANESTHESIA/SEDATION: Moderate (conscious) sedation was used. 2  mg Versed, 100 mcg Fentanyl were administered intravenously. The patient's vital signs were monitored continuously by radiology nursing throughout the procedure.  Sedation Time: 80 minutes  MEDICATIONS: 2 g Ancef and 3000 units heparin administered intravenously. Ancef was administered within 1 hours of skin incision.  FLUOROSCOPY TIME:  15 minutes 30 seconds  CONTRAST:  30mL OMNIPAQUE IOHEXOL 300 MG/ML  SOLN  TECHNIQUE: Informed consent was obtained from the patient following explanation of the procedure, risks, benefits and alternatives. The patient understands, agrees and consents for the procedure. All questions were addressed. A time out was performed.  Maximal barrier sterile technique utilized including caps, mask, sterile gowns, sterile gloves, large sterile drape, hand hygiene, and Betadine skin prep.  The right upper extremity arteriovenous fistula was interrogated with ultrasound. There is subocclusive clot throughout the main stick zone. The vein proximal and distal to this is patent and compressible. However, there is a very weak pulse suggesting possible inflow disease versus high-grade central stenosis.  Local anesthesia was attained by infiltration with 1% lidocaine. Under real-time sonographic guidance, the vein was punctured in antegrade fashion several cm from the arterial anastomosis. A transitional 5 French micro sheath was advanced over a micro wire into the stick zone. 2 mg of tPA was then injected into the thrombus. This was manually macerated by gentle massage while the outflow vein was capped compressed with the ultrasound probe. 3000 units of heparin was injected intravenously.  The transitional micro sheath was then exchanged over a Bentson wire for a working 7 [place] vascular sheath. A Kumpe the catheter was advanced into the right subclavian vein and a central venogram was performed. 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 A pull-back venogram was then performed demonstrating patency of the cephalic arch and proximal cephalic vein.  A Rosen wire was next advanced into the superior vena cava and the subclavian vein was angioplasty to 10 mm using a 10 x 40 mm Conquest balloon. The inflation was maintained at 20 atmospheres with full effacement for 2 minutes. Following angioplasty, the balloon was removed. The 6 French Angiojet device was then inserted over the [place] wire and rheolytic thrombectomy/thrombolysis was performed throughout the region of the fistula with internal thrombus. The Angiojet was then removed and a 7 x 40 mm Conquest balloon was advanced through the sheath an used to further balloon macerate and angioplasty the stick zone and proximal draining vein.  Following this, aspiration was performed through the sheath. There was blood flow however it was extremely week. Therefore, using similar technique to above,  ultrasound-guided puncture of the draining vein in retrograde fashion directed toward the arterial anastomosis was performed with a 21 gauge micropuncture needle. An image was obtained and stored for the medical record. Using the transitional micro sheath, a working 6 [place] vascular sheath was advanced into the vein. A Kumpe the catheter and Glidewire were used to advanced into the arterial anastomosis. A gentle hand injection of contrast material demonstrates stasis within the inflow artery secondary to the 5 French catheter being occlusive. This was confirmed is the catheter was gently removed. Once back within the main draining vein the contrast column slowly washed out into the fistula. There is no distal flow which represents an interval change compared to October of 2014. Unfortunately, the inflow radial artery is extremely atretic. There is no outflow distal to the stenosis. The vessel measures approximately 2 mm in diameter.  Attempts were made to gently catheterize the very small radial artery,  however the 5 French catheter was occlusive and a wire could not make the 90 degree bend. Given the very poor arterial inflow, the chance of obtaining sustained patency of this fistula is near 0. Therefore, further intervention was aborted.  Attention was turned to the right neck for placement of a tunneled hemodialysis catheter.  The right neck was interrogated with ultrasound. The right internal jugular vein is atretic and thick walled and cannot be visualized directly to the subclavian vein. The subclavian vein and external jugular vein are slightly hyper trophic. 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 As the subclavian vein was easily visible from a supraclavicular approach, it was decided to place the catheter directly into this vessel. Local anesthesia was attained by infiltration with 1% lidocaine. A small dermatotomy was made. Under real-time sonographic guidance (an image was obtained and stored for the medical record) the vessels punctured with a 21 gauge micropuncture needle. With the assistance of a 5 French micro sheath, a 0.018 inch wire was advanced into the heart in the intravascular depth measured. The micro wire was then removed and a 0.035 inch wire was advanced through the heart and into the inferior vena cava.  A suitable skin exit site inferior to the clavicle was selected. Local anesthesia was attained by 1% lidocaine infiltration. A small dermatotomy was made. A 19 cm tip to cuff Bard hemo split hemodialysis catheter was then tunneled from the skin exit site to the dermatotomy overlying the venous access site. A peel-away sheath was then advanced over the Bentson wire and into the right heart. The tunneled hemodialysis catheter was then fed through the peel-away sheath and the tips positioned in the upper right atrium under fluoroscopic imaging. An image was obtained and stored.  The catheter flushed and aspirated with ease and was locked with heparinized saline. The catheter was  then secured to the skin with 0 Prolene suture and a sterile bandage. The dermatotomy over the venous access site was sealed with Dermabond. The patient tolerated the procedure well.  COMPLICATIONS: None  IMPRESSION: 1. 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 2. Unsuccessful declot procedure secondary to very poor arterial inflow from a small, atherosclerotic and atretic radial artery. 3. Successful placement of a right IJ approach 19 cm tip to cuff tunneled hemodialysis catheter. Catheter tips are in the upper right atrium and ready for immediate use.  PLAN: 1. Recommend referral to vascular surgery for revision of the right upper extremity arteriovenous fistula. Signed,  Sterling Big, MD  [facility]  [facility]   Electronically Signed   By: Malachy Moan M.D.   On: 07/15/2014 17:16   Ir Angio Av Shunt Addl Access  07/15/2014   CLINICAL DATA:  54 year old male with end-stage renal disease on hemodialysis. His right forearm  EXAM: IR RIGHT DECLOT; PTA VENOUS; ARTERIOVENOUS SHUNT FOR DIALYSIS; ADDITIONAL ACCESS; IR ULTRASOUND GUIDANCE VASC ACCESS RIGHT; IR RIGHT FLOURO GUIDE CV LINE  Date: 07/15/2014  PROCEDURE: 1. Ultrasound-guided antegrade puncture adjacent to the arterial anastomosis 2. Ultrasound-guided retrograde puncture 3. Catheterization of the right subclavian vein with central and pull-back venogram 4. 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 Pharmacomechanical thrombolysis/thrombectomy declot procedure 6. Attempted catheterization of the arterial inflow with limited arteriogram 7. Ultrasound-guided puncture of the right subclavian vein from a supraclavicular approach 8. Placement of a 19 cm tip to cuff tunneled hemodialysis catheter Interventional Radiologist:  Sterling Big, MD  ANESTHESIA/SEDATION: Moderate (conscious) sedation was used. 2 mg Versed, 100 mcg Fentanyl were administered intravenously. The  patient's vital signs were monitored continuously by radiology nursing throughout the procedure.  Sedation Time: 80 minutes  MEDICATIONS: 2 g Ancef and 3000 units heparin administered intravenously. Ancef was administered within 1 hours of skin incision.  FLUOROSCOPY TIME:  15 minutes 30 seconds  CONTRAST:  30mL OMNIPAQUE IOHEXOL 300 MG/ML  SOLN  TECHNIQUE: Informed consent was obtained from the patient following explanation of the procedure, risks, benefits and alternatives. The patient understands, agrees and consents for the procedure. All questions were addressed. A time out was performed.  Maximal barrier sterile technique utilized including caps, mask, sterile gowns, sterile gloves, large sterile drape, hand hygiene, and Betadine skin prep.  The right upper extremity arteriovenous fistula was interrogated with ultrasound. There is subocclusive clot throughout the main stick zone. The vein proximal and distal to this is patent and compressible. However, there is a very weak pulse suggesting possible inflow disease versus high-grade central stenosis.  Local anesthesia was attained by infiltration with 1% lidocaine. Under real-time sonographic guidance, the vein was punctured in antegrade fashion several cm from the arterial anastomosis. A transitional 5 French micro sheath was advanced over a micro wire into the stick zone. 2 mg of tPA was then injected into the thrombus. This was manually macerated by gentle massage while the outflow vein was capped compressed with the ultrasound probe. 3000 units of heparin was injected intravenously.  The transitional micro sheath was then exchanged over a Bentson wire for a working 7 [place] vascular sheath. A Kumpe the catheter was advanced into the right subclavian vein and a central venogram was performed. 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 A pull-back venogram was then performed demonstrating  patency of the cephalic arch and proximal cephalic vein.  A Rosen wire was next advanced into the superior vena cava and the subclavian vein was angioplasty to 10 mm using a 10 x 40 mm Conquest balloon. The inflation was maintained at 20 atmospheres with full effacement for 2 minutes. Following angioplasty, the balloon was removed. The 6 French Angiojet device was then inserted over the [place] wire and rheolytic thrombectomy/thrombolysis was performed throughout the region of the fistula with internal thrombus. The Angiojet was then removed and a 7 x 40 mm Conquest balloon was advanced through the sheath an used to further balloon macerate and angioplasty the stick zone and proximal draining vein.  Following this, aspiration was performed through the sheath. There was blood  flow however it was extremely week. Therefore, using similar technique to above, ultrasound-guided puncture of the draining vein in retrograde fashion directed toward the arterial anastomosis was performed with a 21 gauge micropuncture needle. An image was obtained and stored for the medical record. Using the transitional micro sheath, a working 6 [place] vascular sheath was advanced into the vein. A Kumpe the catheter and Glidewire were used to advanced into the arterial anastomosis. A gentle hand injection of contrast material demonstrates stasis within the inflow artery secondary to the 5 French catheter being occlusive. This was confirmed is the catheter was gently removed. Once back within the main draining vein the contrast column slowly washed out into the fistula. There is no distal flow which represents an interval change compared to October of 2014. Unfortunately, the inflow radial artery is extremely atretic. There is no outflow distal to the stenosis. The vessel measures approximately 2 mm in diameter.  Attempts were made to gently catheterize the very small radial artery, however the 5 French catheter was occlusive and a wire could not  make the 90 degree bend. Given the very poor arterial inflow, the chance of obtaining sustained patency of this fistula is near 0. Therefore, further intervention was aborted.  Attention was turned to the right neck for placement of a tunneled hemodialysis catheter.  The right neck was interrogated with ultrasound. The right internal jugular vein is atretic and thick walled and cannot be visualized directly to the subclavian vein. The subclavian vein and external jugular vein are slightly hyper trophic. 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 As the subclavian vein was easily visible from a supraclavicular approach, it was decided to place the catheter directly into this vessel. Local anesthesia was attained by infiltration with 1% lidocaine. A small dermatotomy was made. Under real-time sonographic guidance (an image was obtained and stored for the medical record) the vessels punctured with a 21 gauge micropuncture needle. With the assistance of a 5 French micro sheath, a 0.018 inch wire was advanced into the heart in the intravascular depth measured. The micro wire was then removed and a 0.035 inch wire was advanced through the heart and into the inferior vena cava.  A suitable skin exit site inferior to the clavicle was selected. Local anesthesia was attained by 1% lidocaine infiltration. A small dermatotomy was made. A 19 cm tip to cuff Bard hemo split hemodialysis catheter was then tunneled from the skin exit site to the dermatotomy overlying the venous access site. A peel-away sheath was then advanced over the Bentson wire and into the right heart. The tunneled hemodialysis catheter was then fed through the peel-away sheath and the tips positioned in the upper right atrium under fluoroscopic imaging. An image was obtained and stored.  The catheter flushed and aspirated with ease and was locked with heparinized saline. The catheter was then secured to the skin with 0 Prolene suture and a sterile  bandage. The dermatotomy over the venous access site was sealed with Dermabond. The patient tolerated the procedure well.  COMPLICATIONS: None  IMPRESSION: 1. 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 2. Unsuccessful declot procedure secondary to very poor arterial inflow from a small, atherosclerotic and atretic radial artery. 3. Successful placement of a right IJ approach 19 cm tip to cuff tunneled hemodialysis catheter. Catheter tips are in the upper right atrium and ready for immediate use.  PLAN: 1. Recommend referral to vascular surgery for revision of the right upper extremity arteriovenous fistula.  Signed,  Sterling Big, MD  [facility]  [facility]   Electronically Signed   By: Malachy Moan M.D.   On: 07/15/2014 17:16   Ir US Guide Vasc Access Right  07/15/2014   CLINICAL DATA:  54 year old male with end-stage renal disease on hemodialysis. His right forearm  EXAM: IR RIGHT DECLOT; PTA VENOUS; ARTERIOVENOUS SHUNT FOR DIALYSIS; ADDITIONAL ACCESS; IR ULTRASOUND GUIDANCE VASC ACCESS RIGHT; IR RIGHT FLOURO GUIDE CV LINE  Date: 07/15/2014  PROCEDURE: 1. Ultrasound-guided antegrade puncture adjacent to the arterial anastomosis 2. Ultrasound-guided retrograde puncture 3. Catheterization of the right subclavian vein with central and pull-back venogram 4. 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 Pharmacomechanical thrombolysis/thrombectomy declot procedure 6. Attempted catheterization of the arterial inflow with limited arteriogram 7. Ultrasound-guided puncture of the right subclavian vein from a supraclavicular approach 8. Placement of a 19 cm tip to cuff tunneled hemodialysis catheter Interventional Radiologist:  Sterling Big, MD  ANESTHESIA/SEDATION: Moderate (conscious) sedation was used. 2 mg Versed, 100 mcg Fentanyl were administered intravenously. The patient's vital signs were monitored continuously by radiology  nursing throughout the procedure.  Sedation Time: 80 minutes  MEDICATIONS: 2 g Ancef and 3000 units heparin administered intravenously. Ancef was administered within 1 hours of skin incision.  FLUOROSCOPY TIME:  15 minutes 30 seconds  CONTRAST:  30mL OMNIPAQUE IOHEXOL 300 MG/ML  SOLN  TECHNIQUE: Informed consent was obtained from the patient following explanation of the procedure, risks, benefits and alternatives. The patient understands, agrees and consents for the procedure. All questions were addressed. A time out was performed.  Maximal barrier sterile technique utilized including caps, mask, sterile gowns, sterile gloves, large sterile drape, hand hygiene, and Betadine skin prep.  The right upper extremity arteriovenous fistula was interrogated with ultrasound. There is subocclusive clot throughout the main stick zone. The vein proximal and distal to this is patent and compressible. However, there is a very weak pulse suggesting possible inflow disease versus high-grade central stenosis.  Local anesthesia was attained by infiltration with 1% lidocaine. Under real-time sonographic guidance, the vein was punctured in antegrade fashion several cm from the arterial anastomosis. A transitional 5 French micro sheath was advanced over a micro wire into the stick zone. 2 mg of tPA was then injected into the thrombus. This was manually macerated by gentle massage while the outflow vein was capped compressed with the ultrasound probe. 3000 units of heparin was injected intravenously.  The transitional micro sheath was then exchanged over a Bentson wire for a working 7 [place] vascular sheath. A Kumpe the catheter was advanced into the right subclavian vein and a central venogram was performed. 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 A pull-back venogram was then performed demonstrating patency of the cephalic arch and proximal cephalic vein.  A Rosen  wire was next advanced into the superior vena cava and the subclavian vein was angioplasty to 10 mm using a 10 x 40 mm Conquest balloon. The inflation was maintained at 20 atmospheres with full effacement for 2 minutes. Following angioplasty, the balloon was removed. The 6 French Angiojet device was then inserted over the [place] wire and rheolytic thrombectomy/thrombolysis was performed throughout the region of the fistula with internal thrombus. The Angiojet was then removed and a 7 x 40 mm Conquest balloon was advanced through the sheath an used to further balloon macerate and angioplasty the stick zone and proximal draining  vein.  Following this, aspiration was performed through the sheath. There was blood flow however it was extremely week. Therefore, using similar technique to above, ultrasound-guided puncture of the draining vein in retrograde fashion directed toward the arterial anastomosis was performed with a 21 gauge micropuncture needle. An image was obtained and stored for the medical record. Using the transitional micro sheath, a working 6 [place] vascular sheath was advanced into the vein. A Kumpe the catheter and Glidewire were used to advanced into the arterial anastomosis. A gentle hand injection of contrast material demonstrates stasis within the inflow artery secondary to the 5 French catheter being occlusive. This was confirmed is the catheter was gently removed. Once back within the main draining vein the contrast column slowly washed out into the fistula. There is no distal flow which represents an interval change compared to October of 2014. Unfortunately, the inflow radial artery is extremely atretic. There is no outflow distal to the stenosis. The vessel measures approximately 2 mm in diameter.  Attempts were made to gently catheterize the very small radial artery, however the 5 French catheter was occlusive and a wire could not make the 90 degree bend. Given the very poor arterial inflow, the  chance of obtaining sustained patency of this fistula is near 0. Therefore, further intervention was aborted.  Attention was turned to the right neck for placement of a tunneled hemodialysis catheter.  The right neck was interrogated with ultrasound. The right internal jugular vein is atretic and thick walled and cannot be visualized directly to the subclavian vein. The subclavian vein and external jugular vein are slightly hyper trophic. 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 As the subclavian vein was easily visible from a supraclavicular approach, it was decided to place the catheter directly into this vessel. Local anesthesia was attained by infiltration with 1% lidocaine. A small dermatotomy was made. Under real-time sonographic guidance (an image was obtained and stored for the medical record) the vessels punctured with a 21 gauge micropuncture needle. With the assistance of a 5 French micro sheath, a 0.018 inch wire was advanced into the heart in the intravascular depth measured. The micro wire was then removed and a 0.035 inch wire was advanced through the heart and into the inferior vena cava.  A suitable skin exit site inferior to the clavicle was selected. Local anesthesia was attained by 1% lidocaine infiltration. A small dermatotomy was made. A 19 cm tip to cuff Bard hemo split hemodialysis catheter was then tunneled from the skin exit site to the dermatotomy overlying the venous access site. A peel-away sheath was then advanced over the Bentson wire and into the right heart. The tunneled hemodialysis catheter was then fed through the peel-away sheath and the tips positioned in the upper right atrium under fluoroscopic imaging. An image was obtained and stored.  The catheter flushed and aspirated with ease and was locked with heparinized saline. The catheter was then secured to the skin with 0 Prolene suture and a sterile bandage. The dermatotomy over the venous access site was sealed with  Dermabond. The patient tolerated the procedure well.  COMPLICATIONS: None  IMPRESSION: 1. 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 2. Unsuccessful declot procedure secondary to very poor arterial inflow from a small, atherosclerotic and atretic radial artery. 3. Successful placement of a right IJ approach 19 cm tip to cuff tunneled hemodialysis catheter. Catheter tips are in the upper right atrium and ready for immediate use.  PLAN: 1. Recommend  referral to vascular surgery for revision of the right upper extremity arteriovenous fistula. Signed,  Sterling Big, MD  [facility]  [facility]   Electronically Signed   By: Malachy Moan M.D.   On: 07/15/2014 17:16   Ir US Guide Vasc Access Right  07/15/2014   CLINICAL DATA:  54 year old male with end-stage renal disease on hemodialysis. His right forearm  EXAM: IR RIGHT DECLOT; PTA VENOUS; ARTERIOVENOUS SHUNT FOR DIALYSIS; ADDITIONAL ACCESS; IR ULTRASOUND GUIDANCE VASC ACCESS RIGHT; IR RIGHT FLOURO GUIDE CV LINE  Date: 07/15/2014  PROCEDURE: 1. Ultrasound-guided antegrade puncture adjacent to the arterial anastomosis 2. Ultrasound-guided retrograde puncture 3. Catheterization of the right subclavian vein with central and pull-back venogram 4. 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 Pharmacomechanical thrombolysis/thrombectomy declot procedure 6. Attempted catheterization of the arterial inflow with limited arteriogram 7. Ultrasound-guided puncture of the right subclavian vein from a supraclavicular approach 8. Placement of a 19 cm tip to cuff tunneled hemodialysis catheter Interventional Radiologist:  Sterling Big, MD  ANESTHESIA/SEDATION: Moderate (conscious) sedation was used. 2 mg Versed, 100 mcg Fentanyl were administered intravenously. The patient's vital signs were monitored continuously by radiology nursing throughout the procedure.  Sedation Time: 80 minutes   MEDICATIONS: 2 g Ancef and 3000 units heparin administered intravenously. Ancef was administered within 1 hours of skin incision.  FLUOROSCOPY TIME:  15 minutes 30 seconds  CONTRAST:  30mL OMNIPAQUE IOHEXOL 300 MG/ML  SOLN  TECHNIQUE: Informed consent was obtained from the patient following explanation of the procedure, risks, benefits and alternatives. The patient understands, agrees and consents for the procedure. All questions were addressed. A time out was performed.  Maximal barrier sterile technique utilized including caps, mask, sterile gowns, sterile gloves, large sterile drape, hand hygiene, and Betadine skin prep.  The right upper extremity arteriovenous fistula was interrogated with ultrasound. There is subocclusive clot throughout the main stick zone. The vein proximal and distal to this is patent and compressible. However, there is a very weak pulse suggesting possible inflow disease versus high-grade central stenosis.  Local anesthesia was attained by infiltration with 1% lidocaine. Under real-time sonographic guidance, the vein was punctured in antegrade fashion several cm from the arterial anastomosis. A transitional 5 French micro sheath was advanced over a micro wire into the stick zone. 2 mg of tPA was then injected into the thrombus. This was manually macerated by gentle massage while the outflow vein was capped compressed with the ultrasound probe. 3000 units of heparin was injected intravenously.  The transitional micro sheath was then exchanged over a Bentson wire for a working 7 [place] vascular sheath. A Kumpe the catheter was advanced into the right subclavian vein and a central venogram was performed. 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 A pull-back venogram was then performed demonstrating patency of the cephalic arch and proximal cephalic vein.  A Rosen wire was next advanced into the superior vena cava and the  subclavian vein was angioplasty to 10 mm using a 10 x 40 mm Conquest balloon. The inflation was maintained at 20 atmospheres with full effacement for 2 minutes. Following angioplasty, the balloon was removed. The 6 French Angiojet device was then inserted over the [place] wire and rheolytic thrombectomy/thrombolysis was performed throughout the region of the fistula with internal thrombus. The Angiojet was then removed and a 7 x 40 mm Conquest balloon was advanced through the sheath an  used to further balloon macerate and angioplasty the stick zone and proximal draining vein.  Following this, aspiration was performed through the sheath. There was blood flow however it was extremely week. Therefore, using similar technique to above, ultrasound-guided puncture of the draining vein in retrograde fashion directed toward the arterial anastomosis was performed with a 21 gauge micropuncture needle. An image was obtained and stored for the medical record. Using the transitional micro sheath, a working 6 [place] vascular sheath was advanced into the vein. A Kumpe the catheter and Glidewire were used to advanced into the arterial anastomosis. A gentle hand injection of contrast material demonstrates stasis within the inflow artery secondary to the 5 French catheter being occlusive. This was confirmed is the catheter was gently removed. Once back within the main draining vein the contrast column slowly washed out into the fistula. There is no distal flow which represents an interval change compared to October of 2014. Unfortunately, the inflow radial artery is extremely atretic. There is no outflow distal to the stenosis. The vessel measures approximately 2 mm in diameter.  Attempts were made to gently catheterize the very small radial artery, however the 5 French catheter was occlusive and a wire could not make the 90 degree bend. Given the very poor arterial inflow, the chance of obtaining sustained patency of this fistula is  near 0. Therefore, further intervention was aborted.  Attention was turned to the right neck for placement of a tunneled hemodialysis catheter.  The right neck was interrogated with ultrasound. The right internal jugular vein is atretic and thick walled and cannot be visualized directly to the subclavian vein. The subclavian vein and external jugular vein are slightly hyper trophic. 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 As the subclavian vein was easily visible from a supraclavicular approach, it was decided to place the catheter directly into this vessel. Local anesthesia was attained by infiltration with 1% lidocaine. A small dermatotomy was made. Under real-time sonographic guidance (an image was obtained and stored for the medical record) the vessels punctured with a 21 gauge micropuncture needle. With the assistance of a 5 French micro sheath, a 0.018 inch wire was advanced into the heart in the intravascular depth measured. The micro wire was then removed and a 0.035 inch wire was advanced through the heart and into the inferior vena cava.  A suitable skin exit site inferior to the clavicle was selected. Local anesthesia was attained by 1% lidocaine infiltration. A small dermatotomy was made. A 19 cm tip to cuff Bard hemo split hemodialysis catheter was then tunneled from the skin exit site to the dermatotomy overlying the venous access site. A peel-away sheath was then advanced over the Bentson wire and into the right heart. The tunneled hemodialysis catheter was then fed through the peel-away sheath and the tips positioned in the upper right atrium under fluoroscopic imaging. An image was obtained and stored.  The catheter flushed and aspirated with ease and was locked with heparinized saline. The catheter was then secured to the skin with 0 Prolene suture and a sterile bandage. The dermatotomy over the venous access site was sealed with Dermabond. The patient tolerated the procedure well.   COMPLICATIONS: None  IMPRESSION: 1. 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 2. Unsuccessful declot procedure secondary to very poor arterial inflow from a small, atherosclerotic and atretic radial artery. 3. Successful placement of a right IJ approach 19 cm tip to cuff tunneled hemodialysis catheter. Catheter tips are in  the upper right atrium and ready for immediate use.  PLAN: 1. Recommend referral to vascular surgery for revision of the right upper extremity arteriovenous fistula. Signed,  Sterling Big, MD  [facility]  [facility]   Electronically Signed   By: Malachy Moan M.D.   On: 07/15/2014 17:16   Dg Chest Port 1 View  07/15/2014   CLINICAL DATA:  Preoperative exam prior to declining procedure for right arm dialysis fistula  EXAM: PORTABLE CHEST - 1 VIEW  COMPARISON:  PA and lateral chest x-ray of July 13, 2014  FINDINGS: The lungs are reasonably well inflated and clear. The cardiac silhouette is top-normal in size. The pulmonary vascularity is within the limits of normal. There is no pleural effusion. The mediastinum is mildly widened though stable. The trachea is midline. The bony thorax is unremarkable.  IMPRESSION: Mild stable enlargement of the cardiac silhouette. There is no active cardiopulmonary disease.   Electronically Signed   By: David  [place]   On: 07/15/2014 07:30   Ir Declot Right Mod Sed  07/15/2014   CLINICAL DATA:  54 year old male with end-stage renal disease on hemodialysis. His right forearm  EXAM: IR RIGHT DECLOT; PTA VENOUS; ARTERIOVENOUS SHUNT FOR DIALYSIS; ADDITIONAL ACCESS; IR ULTRASOUND GUIDANCE VASC ACCESS RIGHT; IR RIGHT FLOURO GUIDE CV LINE  Date: 07/15/2014  PROCEDURE: 1. Ultrasound-guided antegrade puncture adjacent to the arterial anastomosis 2. Ultrasound-guided retrograde puncture 3. Catheterization of the right subclavian vein with central and pull-back venogram 4. 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 Pharmacomechanical thrombolysis/thrombectomy declot procedure 6. Attempted catheterization of the arterial inflow with limited arteriogram 7. Ultrasound-guided puncture of the right subclavian vein from a supraclavicular approach 8. Placement of a 19 cm tip to cuff tunneled hemodialysis catheter Interventional Radiologist:  Sterling Big, MD  ANESTHESIA/SEDATION: Moderate (conscious) sedation was used. 2 mg Versed, 100 mcg Fentanyl were administered intravenously. The patient's vital signs were monitored continuously by radiology nursing throughout the procedure.  Sedation Time: 80 minutes  MEDICATIONS: 2 g Ancef and 3000 units heparin administered intravenously. Ancef was administered within 1 hours of skin incision.  FLUOROSCOPY TIME:  15 minutes 30 seconds  CONTRAST:  30mL OMNIPAQUE IOHEXOL 300 MG/ML  SOLN  TECHNIQUE: Informed consent was obtained from the patient following explanation of the procedure, risks, benefits and alternatives. The patient understands, agrees and consents for the procedure. All questions were addressed. A time out was performed.  Maximal barrier sterile technique utilized including caps, mask, sterile gowns, sterile gloves, large sterile drape, hand hygiene, and Betadine skin prep.  The right upper extremity arteriovenous fistula was interrogated with ultrasound. There is subocclusive clot throughout the main stick zone. The vein proximal and distal to this is patent and compressible. However, there is a very weak pulse suggesting possible inflow disease versus high-grade central stenosis.  Local anesthesia was attained by infiltration with 1% lidocaine. Under real-time sonographic guidance, the vein was punctured in antegrade fashion several cm from the arterial anastomosis. A transitional 5 French micro sheath was advanced over a micro wire into the stick zone. 2 mg of tPA was then injected into the thrombus. This was manually macerated by  gentle massage while the outflow vein was capped compressed with the ultrasound probe. 3000 units of heparin was injected intravenously.  The transitional micro sheath was then exchanged over a Bentson wire for a working 7 [place] vascular sheath. A Kumpe the catheter was advanced into the right subclavian vein and  a central venogram was performed. 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 A pull-back venogram was then performed demonstrating patency of the cephalic arch and proximal cephalic vein.  A Rosen wire was next advanced into the superior vena cava and the subclavian vein was angioplasty to 10 mm using a 10 x 40 mm Conquest balloon. The inflation was maintained at 20 atmospheres with full effacement for 2 minutes. Following angioplasty, the balloon was removed. The 6 French Angiojet device was then inserted over the Gleason wire and rheolytic thrombectomy/thrombolysis was performed throughout the region of the fistula with internal thrombus. The Angiojet was then removed and a 7 x 40 mm Conquest balloon was advanced through the sheath an used to further balloon macerate and angioplasty the stick zone and proximal draining vein.  Following this, aspiration was performed through the sheath. There was blood flow however it was extremely week. Therefore, using similar technique to above, ultrasound-guided puncture of the draining vein in retrograde fashion directed toward the arterial anastomosis was performed with a 21 gauge micropuncture needle. An image was obtained and stored for the medical record. Using the transitional micro sheath, a working 6 [place] vascular sheath was advanced into the vein. A Kumpe the catheter and Glidewire were used to advanced into the arterial anastomosis. A gentle hand injection of contrast material demonstrates stasis within the inflow artery secondary to the 5 French catheter being occlusive. This was confirmed  is the catheter was gently removed. Once back within the main draining vein the contrast column slowly washed out into the fistula. There is no distal flow which represents an interval change compared to October of 2014. Unfortunately, the inflow radial artery is extremely atretic. There is no outflow distal to the stenosis. The vessel measures approximately 2 mm in diameter.  Attempts were made to gently catheterize the very small radial artery, however the 5 French catheter was occlusive and a wire could not make the 90 degree bend. Given the very poor arterial inflow, the chance of obtaining sustained patency of this fistula is near 0. Therefore, further intervention was aborted.  Attention was turned to the right neck for placement of a tunneled hemodialysis catheter.  The right neck was interrogated with ultrasound. The right internal jugular vein is atretic and thick walled and cannot be visualized directly to the subclavian vein. The subclavian vein and external jugular vein are slightly hyper trophic. 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 As the subclavian vein was easily visible from a supraclavicular approach, it was decided to place the catheter directly into this vessel. Local anesthesia was attained by infiltration with 1% lidocaine. A small dermatotomy was made. Under real-time sonographic guidance (an image was obtained and stored for the medical record) the vessels punctured with a 21 gauge micropuncture needle. With the assistance of a 5 French micro sheath, a 0.018 inch wire was advanced into the heart in the intravascular depth measured. The micro wire was then removed and a 0.035 inch wire was advanced through the heart and into the inferior vena cava.  A suitable skin exit site inferior to the clavicle was selected. Local anesthesia was attained by 1% lidocaine infiltration. A small dermatotomy was made. A 19 cm tip to cuff Bard hemo split hemodialysis catheter was then tunneled from  the skin exit site to the dermatotomy overlying the venous access site. A peel-away sheath was then advanced over the Bentson wire and  into the right heart. The tunneled hemodialysis catheter was then fed through the peel-away sheath and the tips positioned in the upper right atrium under fluoroscopic imaging. An image was obtained and stored.  The catheter flushed and aspirated with ease and was locked with heparinized saline. The catheter was then secured to the skin with 0 Prolene suture and a sterile bandage. The dermatotomy over the venous access site was sealed with Dermabond. The patient tolerated the procedure well.  COMPLICATIONS: None  IMPRESSION: 1. 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 2. Unsuccessful declot procedure secondary to very poor arterial inflow from a small, atherosclerotic and atretic radial artery. 3. Successful placement of a right IJ approach 19 cm tip to cuff tunneled hemodialysis catheter. Catheter tips are in the upper right atrium and ready for immediate use.  PLAN: 1. Recommend referral to vascular surgery for revision of the right upper extremity arteriovenous fistula. Signed,  Sterling Big, MD  [facility]  [facility]   Electronically Signed   By: Malachy Moan M.D.   On: 07/15/2014 17:16    ASSESSMENT / PLAN:     R Fem HD cath 2/22 >>> R Fem A line 2/22 >>>2/24 discontinued Hypotension -  Has resolved after gentle hydration. Hypertension continue prn labetalol , Norvasc, lisinopril Holding outpatient amlodipine, lisinopril, simvastatin. Trop mild elevation: 0.05      ESRD on dialysis M/W/F Uremia AG metabolic acidosis - due to uremia / renal failure- Resolved Hyponatremia - Resolved Mild hyperkalemia - received kayxelate in ED Nephrology following - CRRT stopped 2/23; last hemodialysis 2/24 IR following: Status post thrombolysis of Rt arm dialysis fistula- and possible pta/stent  placement on 2/24 Patient needs left thigh AV graft for hemodialysis Friday morning at 9 AM Antibiotic preprocedure    ? Pancreatitis - lipase 105 + generalized abd pain - hypoperfusion likley Diarrhea Repeat lipase improved 97 (2/23) < 399  C. difficile negative on 2/25 Started on Imodium     Anemia  Esrd, r/o losses Thrombocytopenia VTE Prophylaxis Transfuse per usual ICU guidelines; s/p 1u pRBC 2/24 Hemoglobin 6.6> 9.1, platelets stable SCD's / Heparin.(no bleeding noted) epo per renal CBC in AM. Positive FOBT Start the patient on Protonix      AT RISK for vascular infection, infected clot P:   Monitor clinically. Pct 2.46 Low threshold for empiric ABX, ceftaz, vanc (will dose) This is the patient group that fly under radar and difficult to detect sepsis BCx NGTD Abx Vanc/Ceftaz 2/23 (1pm) 2 days, now off antibiotics     Diabetes  Continue SSI Hold outpatient glipizide.     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 Low dose fentanyl PRN.   Family updated: None.     Richarda Overlie, MD,  Pager [phone] 07/16/2014 12:50 PM  ]

## 2014-07-16 NOTE — Progress Notes (Signed)
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 .   Diarrhea ? Antibiotic related  Plan Remove left femoral catheter, for AV acces in groin in AM, then HD; imodium  Subjective: Interval History:Diarrhea  Objective: Vital signs in last 24 hours: Temp:  [97.4 F (36.3 C)-99.3 F (37.4 C)] 98.6 F (37 C) (02/25 0917) Pulse Rate:  [60-73] 70 (02/25 0917) Resp:  [8-20] 17 (02/25 0917) BP: (118-188)/(60-85) 156/66 mmHg (02/25 0917) SpO2:  [100 %] 100 % (02/25 0917) Weight:  [83.6 kg (184 lb 4.9 oz)-84.5 kg (186 lb 4.6 oz)] 83.825 kg (184 lb 12.8 oz) (02/24 2224) Weight change: -0.8 kg (-1 lb 12.2 oz)  Intake/Output from previous day: 02/24 0701 - 02/25 0700 In: 355 [I.V.:50; Blood:305] Out: 2503 [Stool:3] Intake/Output this shift: Total I/O In: 320 [P.O.:320] Out: -   No change  Catheter in left groin  Lab Results:  Recent Labs  07/15/14 0420 07/15/14 2330  WBC 5.2 5.8  HGB 6.6* 9.1*  HCT 18.5* 26.5*  PLT 130* 141*   BMET:  Recent Labs  07/14/14 1600 07/15/14 0420  NA 133* 135  K 3.3* 3.6  CL 104 101  CO2 19 20  GLUCOSE 195* 111*  BUN 82* 71*  CREATININE 11.88* 11.88*  CALCIUM 8.3* 8.5   No results for input(s): PTH in the last 72 hours. Iron Studies:  Recent Labs  07/14/14 1057  IRON 158*  TIBC 184*  FERRITIN 939*   Studies/Results: Ir Pta Venous Right  07/15/2014   CLINICAL DATA:  54 year old male with end-stage renal disease on hemodialysis. His right forearm  EXAM: IR RIGHT DECLOT; PTA VENOUS; ARTERIOVENOUS SHUNT FOR DIALYSIS; ADDITIONAL ACCESS; IR ULTRASOUND GUIDANCE VASC ACCESS RIGHT; IR RIGHT FLOURO GUIDE CV LINE  Date: 07/15/2014  PROCEDURE: 1. Ultrasound-guided antegrade puncture adjacent to the arterial anastomosis 2. Ultrasound-guided retrograde puncture 3. Catheterization of the right subclavian vein with central and pull-back venogram 4. 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 Pharmacomechanical thrombolysis/thrombectomy declot procedure 6. Attempted catheterization of the arterial inflow with limited arteriogram 7. Ultrasound-guided puncture of the right subclavian vein from a supraclavicular approach 8. Placement of a 19 cm tip to cuff tunneled hemodialysis catheter Interventional Radiologist:  Sterling Big, MD  ANESTHESIA/SEDATION: Moderate (conscious) sedation was used. 2 mg Versed, 100 mcg Fentanyl were administered intravenously. The patient's vital signs were monitored continuously by radiology nursing throughout the procedure.  Sedation Time: 80 minutes  MEDICATIONS: 2 g Ancef and 3000 units heparin administered intravenously. Ancef was administered within 1 hours of skin incision.  FLUOROSCOPY TIME:  15 minutes 30 seconds  CONTRAST:  30mL OMNIPAQUE IOHEXOL 300 MG/ML  SOLN  TECHNIQUE: Informed consent was obtained from the patient following explanation of the procedure, risks, benefits and alternatives. The patient understands, agrees and consents for the procedure. All questions were addressed. A time out was performed.  Maximal barrier sterile technique utilized including caps, mask, sterile gowns, sterile gloves, large sterile drape, hand hygiene, and Betadine skin prep.  The right upper extremity arteriovenous fistula was interrogated with ultrasound. There is subocclusive clot throughout the main stick zone. The vein proximal and distal to this is patent and compressible. However, there is a very weak pulse suggesting possible inflow disease versus high-grade central stenosis.  Local anesthesia was attained by infiltration with 1% lidocaine. Under real-time sonographic guidance, the vein was punctured in antegrade fashion several cm from the arterial anastomosis. A  transitional 5 French micro sheath was advanced over a micro wire into the stick zone. 2 mg of tPA was then injected into the thrombus. This was manually macerated by  gentle massage while the outflow vein was capped compressed with the ultrasound probe. 3000 units of heparin was injected intravenously.  The transitional micro sheath was then exchanged over a Bentson wire for a working 7 [place] vascular sheath. A Kumpe the catheter was advanced into the right subclavian vein and a central venogram was performed. 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 A pull-back venogram was then performed demonstrating patency of the cephalic arch and proximal cephalic vein.  A Rosen wire was next advanced into the superior vena cava and the subclavian vein was angioplasty to 10 mm using a 10 x 40 mm Conquest balloon. The inflation was maintained at 20 atmospheres with full effacement for 2 minutes. Following angioplasty, the balloon was removed. The 6 French Angiojet device was then inserted over the [place] wire and rheolytic thrombectomy/thrombolysis was performed throughout the region of the fistula with internal thrombus. The Angiojet was then removed and a 7 x 40 mm Conquest balloon was advanced through the sheath an used to further balloon macerate and angioplasty the stick zone and proximal draining vein.  Following this, aspiration was performed through the sheath. There was blood flow however it was extremely week. Therefore, using similar technique to above, ultrasound-guided puncture of the draining vein in retrograde fashion directed toward the arterial anastomosis was performed with a 21 gauge micropuncture needle. An image was obtained and stored for the medical record. Using the transitional micro sheath, a working 6 [place] vascular sheath was advanced into the vein. A Kumpe the catheter and Glidewire were used to advanced into the arterial anastomosis. A gentle hand injection of contrast material demonstrates stasis within the inflow artery secondary to the 5 French catheter being occlusive. This was confirmed  is the catheter was gently removed. Once back within the main draining vein the contrast column slowly washed out into the fistula. There is no distal flow which represents an interval change compared to October of 2014. Unfortunately, the inflow radial artery is extremely atretic. There is no outflow distal to the stenosis. The vessel measures approximately 2 mm in diameter.  Attempts were made to gently catheterize the very small radial artery, however the 5 French catheter was occlusive and a wire could not make the 90 degree bend. Given the very poor arterial inflow, the chance of obtaining sustained patency of this fistula is near 0. Therefore, further intervention was aborted.  Attention was turned to the right neck for placement of a tunneled hemodialysis catheter.  The right neck was interrogated with ultrasound. The right internal jugular vein is atretic and thick walled and cannot be visualized directly to the subclavian vein. The subclavian vein and external jugular vein are slightly hyper trophic. 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 As the subclavian vein was easily visible from a supraclavicular approach, it was decided to place the catheter directly into this vessel. Local anesthesia was attained by infiltration with 1% lidocaine. A small dermatotomy was made. Under real-time sonographic guidance (an image was obtained and stored for the medical record) the vessels punctured with a 21 gauge micropuncture needle. With the assistance of a 5 French micro sheath, a 0.018 inch wire was advanced into the heart in the intravascular depth measured. The micro wire was  then removed and a 0.035 inch wire was advanced through the heart and into the inferior vena cava.  A suitable skin exit site inferior to the clavicle was selected. Local anesthesia was attained by 1% lidocaine infiltration. A small dermatotomy was made. A 19 cm tip to cuff Bard hemo split hemodialysis catheter was then tunneled from  the skin exit site to the dermatotomy overlying the venous access site. A peel-away sheath was then advanced over the Bentson wire and into the right heart. The tunneled hemodialysis catheter was then fed through the peel-away sheath and the tips positioned in the upper right atrium under fluoroscopic imaging. An image was obtained and stored.  The catheter flushed and aspirated with ease and was locked with heparinized saline. The catheter was then secured to the skin with 0 Prolene suture and a sterile bandage. The dermatotomy over the venous access site was sealed with Dermabond. The patient tolerated the procedure well.  COMPLICATIONS: None  IMPRESSION: 1. 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 2. Unsuccessful declot procedure secondary to very poor arterial inflow from a small, atherosclerotic and atretic radial artery. 3. Successful placement of a right IJ approach 19 cm tip to cuff tunneled hemodialysis catheter. Catheter tips are in the upper right atrium and ready for immediate use.  PLAN: 1. Recommend referral to vascular surgery for revision of the right upper extremity arteriovenous fistula. Signed,  Sterling Big, MD  [facility]  [facility]   Electronically Signed   By: Malachy Moan M.D.   On: 07/15/2014 17:16   Ir Pta Venous Right  07/15/2014   CLINICAL DATA:  54 year old male with end-stage renal disease on hemodialysis. His right forearm  EXAM: IR RIGHT DECLOT; PTA VENOUS; ARTERIOVENOUS SHUNT FOR DIALYSIS; ADDITIONAL ACCESS; IR ULTRASOUND GUIDANCE VASC ACCESS RIGHT; IR RIGHT FLOURO GUIDE CV LINE  Date: 07/15/2014  PROCEDURE: 1. Ultrasound-guided antegrade puncture adjacent to the arterial anastomosis 2. Ultrasound-guided retrograde puncture 3. Catheterization of the right subclavian vein with central and pull-back venogram 4. 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 Pharmacomechanical  thrombolysis/thrombectomy declot procedure 6. Attempted catheterization of the arterial inflow with limited arteriogram 7. Ultrasound-guided puncture of the right subclavian vein from a supraclavicular approach 8. Placement of a 19 cm tip to cuff tunneled hemodialysis catheter Interventional Radiologist:  Sterling Big, MD  ANESTHESIA/SEDATION: Moderate (conscious) sedation was used. 2 mg Versed, 100 mcg Fentanyl were administered intravenously. The patient's vital signs were monitored continuously by radiology nursing throughout the procedure.  Sedation Time: 80 minutes  MEDICATIONS: 2 g Ancef and 3000 units heparin administered intravenously. Ancef was administered within 1 hours of skin incision.  FLUOROSCOPY TIME:  15 minutes 30 seconds  CONTRAST:  30mL OMNIPAQUE IOHEXOL 300 MG/ML  SOLN  TECHNIQUE: Informed consent was obtained from the patient following explanation of the procedure, risks, benefits and alternatives. The patient understands, agrees and consents for the procedure. All questions were addressed. A time out was performed.  Maximal barrier sterile technique utilized including caps, mask, sterile gowns, sterile gloves, large sterile drape, hand hygiene, and Betadine skin prep.  The right upper extremity arteriovenous fistula was interrogated with ultrasound. There is subocclusive clot throughout the main stick zone. The vein proximal and distal to this is patent and compressible. However, there is a very weak pulse suggesting possible inflow disease versus high-grade central stenosis.  Local anesthesia was attained by infiltration with 1% lidocaine. Under real-time sonographic guidance, the vein was  punctured in antegrade fashion several cm from the arterial anastomosis. A transitional 5 French micro sheath was advanced over a micro wire into the stick zone. 2 mg of tPA was then injected into the thrombus. This was manually macerated by gentle massage while the outflow vein was capped compressed  with the ultrasound probe. 3000 units of heparin was injected intravenously.  The transitional micro sheath was then exchanged over a Bentson wire for a working 7 [place] vascular sheath. A Kumpe the catheter was advanced into the right subclavian vein and a central venogram was performed. 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 A pull-back venogram was then performed demonstrating patency of the cephalic arch and proximal cephalic vein.  A Rosen wire was next advanced into the superior vena cava and the subclavian vein was angioplasty to 10 mm using a 10 x 40 mm Conquest balloon. The inflation was maintained at 20 atmospheres with full effacement for 2 minutes. Following angioplasty, the balloon was removed. The 6 French Angiojet device was then inserted over the [place] wire and rheolytic thrombectomy/thrombolysis was performed throughout the region of the fistula with internal thrombus. The Angiojet was then removed and a 7 x 40 mm Conquest balloon was advanced through the sheath an used to further balloon macerate and angioplasty the stick zone and proximal draining vein.  Following this, aspiration was performed through the sheath. There was blood flow however it was extremely week. Therefore, using similar technique to above, ultrasound-guided puncture of the draining vein in retrograde fashion directed toward the arterial anastomosis was performed with a 21 gauge micropuncture needle. An image was obtained and stored for the medical record. Using the transitional micro sheath, a working 6 [place] vascular sheath was advanced into the vein. A Kumpe the catheter and Glidewire were used to advanced into the arterial anastomosis. A gentle hand injection of contrast material demonstrates stasis within the inflow artery secondary to the 5 French catheter being occlusive. This was confirmed is the catheter was gently removed. Once back within the main  draining vein the contrast column slowly washed out into the fistula. There is no distal flow which represents an interval change compared to October of 2014. Unfortunately, the inflow radial artery is extremely atretic. There is no outflow distal to the stenosis. The vessel measures approximately 2 mm in diameter.  Attempts were made to gently catheterize the very small radial artery, however the 5 French catheter was occlusive and a wire could not make the 90 degree bend. Given the very poor arterial inflow, the chance of obtaining sustained patency of this fistula is near 0. Therefore, further intervention was aborted.  Attention was turned to the right neck for placement of a tunneled hemodialysis catheter.  The right neck was interrogated with ultrasound. The right internal jugular vein is atretic and thick walled and cannot be visualized directly to the subclavian vein. The subclavian vein and external jugular vein are slightly hyper trophic. 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 As the subclavian vein was easily visible from a supraclavicular approach, it was decided to place the catheter directly into this vessel. Local anesthesia was attained by infiltration with 1% lidocaine. A small dermatotomy was made. Under real-time sonographic guidance (an image was obtained and stored for the medical record) the vessels punctured with a 21 gauge micropuncture needle. With the assistance of a 5 French micro sheath, a 0.018 inch wire was advanced into  the heart in the intravascular depth measured. The micro wire was then removed and a 0.035 inch wire was advanced through the heart and into the inferior vena cava.  A suitable skin exit site inferior to the clavicle was selected. Local anesthesia was attained by 1% lidocaine infiltration. A small dermatotomy was made. A 19 cm tip to cuff Bard hemo split hemodialysis catheter was then tunneled from the skin exit site to the dermatotomy overlying the venous  access site. A peel-away sheath was then advanced over the Bentson wire and into the right heart. The tunneled hemodialysis catheter was then fed through the peel-away sheath and the tips positioned in the upper right atrium under fluoroscopic imaging. An image was obtained and stored.  The catheter flushed and aspirated with ease and was locked with heparinized saline. The catheter was then secured to the skin with 0 Prolene suture and a sterile bandage. The dermatotomy over the venous access site was sealed with Dermabond. The patient tolerated the procedure well.  COMPLICATIONS: None  IMPRESSION: 1. 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 2. Unsuccessful declot procedure secondary to very poor arterial inflow from a small, atherosclerotic and atretic radial artery. 3. Successful placement of a right IJ approach 19 cm tip to cuff tunneled hemodialysis catheter. Catheter tips are in the upper right atrium and ready for immediate use.  PLAN: 1. Recommend referral to vascular surgery for revision of the right upper extremity arteriovenous fistula. Signed,  Sterling Big, MD  [facility]  [facility]   Electronically Signed   By: Malachy Moan M.D.   On: 07/15/2014 17:16   Ir Fluoro Guide Cv Line Right  07/15/2014   CLINICAL DATA:  53 year old male with end-stage renal disease on hemodialysis. His right forearm  EXAM: IR RIGHT DECLOT; PTA VENOUS; ARTERIOVENOUS SHUNT FOR DIALYSIS; ADDITIONAL ACCESS; IR ULTRASOUND GUIDANCE VASC ACCESS RIGHT; IR RIGHT FLOURO GUIDE CV LINE  Date: 07/15/2014  PROCEDURE: 1. Ultrasound-guided antegrade puncture adjacent to the arterial anastomosis 2. Ultrasound-guided retrograde puncture 3. Catheterization of the right subclavian vein with central and pull-back venogram 4. 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 Pharmacomechanical thrombolysis/thrombectomy declot procedure 6. Attempted catheterization  of the arterial inflow with limited arteriogram 7. Ultrasound-guided puncture of the right subclavian vein from a supraclavicular approach 8. Placement of a 19 cm tip to cuff tunneled hemodialysis catheter Interventional Radiologist:  Sterling Big, MD  ANESTHESIA/SEDATION: Moderate (conscious) sedation was used. 2 mg Versed, 100 mcg Fentanyl were administered intravenously. The patient's vital signs were monitored continuously by radiology nursing throughout the procedure.  Sedation Time: 80 minutes  MEDICATIONS: 2 g Ancef and 3000 units heparin administered intravenously. Ancef was administered within 1 hours of skin incision.  FLUOROSCOPY TIME:  15 minutes 30 seconds  CONTRAST:  30mL OMNIPAQUE IOHEXOL 300 MG/ML  SOLN  TECHNIQUE: Informed consent was obtained from the patient following explanation of the procedure, risks, benefits and alternatives. The patient understands, agrees and consents for the procedure. All questions were addressed. A time out was performed.  Maximal barrier sterile technique utilized including caps, mask, sterile gowns, sterile gloves, large sterile drape, hand hygiene, and Betadine skin prep.  The right upper extremity arteriovenous fistula was interrogated with ultrasound. There is subocclusive clot throughout the main stick zone. The vein proximal and distal to this is patent and compressible. However, there is a very weak pulse suggesting possible inflow disease versus high-grade central stenosis.  Local anesthesia was  attained by infiltration with 1% lidocaine. Under real-time sonographic guidance, the vein was punctured in antegrade fashion several cm from the arterial anastomosis. A transitional 5 French micro sheath was advanced over a micro wire into the stick zone. 2 mg of tPA was then injected into the thrombus. This was manually macerated by gentle massage while the outflow vein was capped compressed with the ultrasound probe. 3000 units of heparin was injected  intravenously.  The transitional micro sheath was then exchanged over a Bentson wire for a working 7 [place] vascular sheath. A Kumpe the catheter was advanced into the right subclavian vein and a central venogram was performed. 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 A pull-back venogram was then performed demonstrating patency of the cephalic arch and proximal cephalic vein.  A Rosen wire was next advanced into the superior vena cava and the subclavian vein was angioplasty to 10 mm using a 10 x 40 mm Conquest balloon. The inflation was maintained at 20 atmospheres with full effacement for 2 minutes. Following angioplasty, the balloon was removed. The 6 French Angiojet device was then inserted over the [place] wire and rheolytic thrombectomy/thrombolysis was performed throughout the region of the fistula with internal thrombus. The Angiojet was then removed and a 7 x 40 mm Conquest balloon was advanced through the sheath an used to further balloon macerate and angioplasty the stick zone and proximal draining vein.  Following this, aspiration was performed through the sheath. There was blood flow however it was extremely week. Therefore, using similar technique to above, ultrasound-guided puncture of the draining vein in retrograde fashion directed toward the arterial anastomosis was performed with a 21 gauge micropuncture needle. An image was obtained and stored for the medical record. Using the transitional micro sheath, a working 6 [place] vascular sheath was advanced into the vein. A Kumpe the catheter and Glidewire were used to advanced into the arterial anastomosis. A gentle hand injection of contrast material demonstrates stasis within the inflow artery secondary to the 5 French catheter being occlusive. This was confirmed is the catheter was gently removed. Once back within the main draining vein the contrast column slowly washed out into the  fistula. There is no distal flow which represents an interval change compared to October of 2014. Unfortunately, the inflow radial artery is extremely atretic. There is no outflow distal to the stenosis. The vessel measures approximately 2 mm in diameter.  Attempts were made to gently catheterize the very small radial artery, however the 5 French catheter was occlusive and a wire could not make the 90 degree bend. Given the very poor arterial inflow, the chance of obtaining sustained patency of this fistula is near 0. Therefore, further intervention was aborted.  Attention was turned to the right neck for placement of a tunneled hemodialysis catheter.  The right neck was interrogated with ultrasound. The right internal jugular vein is atretic and thick walled and cannot be visualized directly to the subclavian vein. The subclavian vein and external jugular vein are slightly hyper trophic. 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 As the subclavian vein was easily visible from a supraclavicular approach, it was decided to place the catheter directly into this vessel. Local anesthesia was attained by infiltration with 1% lidocaine. A small dermatotomy was made. Under real-time sonographic guidance (an image was obtained and stored for the medical record) the vessels punctured with a 21 gauge micropuncture needle. With the assistance  of a 5 [place] micro sheath, a 0.018 inch wire was advanced into the heart in the intravascular depth measured. The micro wire was then removed and a 0.035 inch wire was advanced through the heart and into the inferior vena cava.  A suitable skin exit site inferior to the clavicle was selected. Local anesthesia was attained by 1% lidocaine infiltration. A small dermatotomy was made. A 19 cm tip to cuff Bard hemo split hemodialysis catheter was then tunneled from the skin exit site to the dermatotomy overlying the venous access site. A peel-away sheath was then advanced over the  [place]ents[place] wire and into the right heart. The tunneled hemodialysis catheter was then fed through the peel-away sheath and the tips positioned in the upper right atrium under fluoroscopic imaging. An image was obtained and stored.  The catheter flushed and aspirated with ease and was locked with heparinized saline. The catheter was then secured to the skin with 0 Prolene suture and a sterile bandage. The dermatotomy over the venous access site was sealed with Dermabond. The patient tolerated the procedure well.  COMPLICATIONS: None  IMPRESSION: 1. 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 2. Unsuccessful declot procedure secondary to very poor arterial inflow from a small, atherosclerotic and atretic radial artery. 3. Successful placement of a right IJ approach 19 cm tip to cuff tunneled hemodialysis catheter. Catheter tips are in the upper right atrium and ready for immediate use.  PLAN: 1. Recommend referral to vascular surgery for revision of the right upper extremity arteriovenous fistula. Signed,  Sterling Big, MD  [facility]  [facility]   Electronically Signed   By: Malachy Moan M.D.   On: 07/15/2014 17:16   Ir Angio Av Shunt Addl Access  07/15/2014   CLINICAL DATA:  54 year old male with end-stage renal disease on hemodialysis. His right forearm  EXAM: IR RIGHT DECLOT; PTA VENOUS; ARTERIOVENOUS SHUNT FOR DIALYSIS; ADDITIONAL ACCESS; IR ULTRASOUND GUIDANCE VASC ACCESS RIGHT; IR RIGHT FLOURO GUIDE CV LINE  Date: 07/15/2014  PROCEDURE: 1. Ultrasound-guided antegrade puncture adjacent to the arterial anastomosis 2. Ultrasound-guided retrograde puncture 3. Catheterization of the right subclavian vein with central and pull-back venogram 4. 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 Pharmacomechanical thrombolysis/thrombectomy declot procedure 6. Attempted catheterization of the arterial inflow with limited arteriogram 7.  Ultrasound-guided puncture of the right subclavian vein from a supraclavicular approach 8. Placement of a 19 cm tip to cuff tunneled hemodialysis catheter Interventional Radiologist:  Sterling Big, MD  ANESTHESIA/SEDATION: Moderate (conscious) sedation was used. 2 mg Versed, 100 mcg Fentanyl were administered intravenously. The patient's vital signs were monitored continuously by radiology nursing throughout the procedure.  Sedation Time: 80 minutes  MEDICATIONS: 2 g Ancef and 3000 units heparin administered intravenously. Ancef was administered within 1 hours of skin incision.  FLUOROSCOPY TIME:  15 minutes 30 seconds  CONTRAST:  30mL OMNIPAQUE IOHEXOL 300 MG/ML  SOLN  TECHNIQUE: Informed consent was obtained from the patient following explanation of the procedure, risks, benefits and alternatives. The patient understands, agrees and consents for the procedure. All questions were addressed. A time out was performed.  Maximal barrier sterile technique utilized including caps, mask, sterile gowns, sterile gloves, large sterile drape, hand hygiene, and Betadine skin prep.  The right upper extremity arteriovenous fistula was interrogated with ultrasound. There is subocclusive clot throughout the main stick zone. The vein proximal and distal to this is patent and compressible. However, there is a very weak  pulse suggesting possible inflow disease versus high-grade central stenosis.  Local anesthesia was attained by infiltration with 1% lidocaine. Under real-time sonographic guidance, the vein was punctured in antegrade fashion several cm from the arterial anastomosis. A transitional 5 French micro sheath was advanced over a micro wire into the stick zone. 2 mg of tPA was then injected into the thrombus. This was manually macerated by gentle massage while the outflow vein was capped compressed with the ultrasound probe. 3000 units of heparin was injected intravenously.  The transitional micro sheath was then  exchanged over a Bentson wire for a working 7 [place] vascular sheath. A Kumpe the catheter was advanced into the right subclavian vein and a central venogram was performed. 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 A pull-back venogram was then performed demonstrating patency of the cephalic arch and proximal cephalic vein.  A Rosen wire was next advanced into the superior vena cava and the subclavian vein was angioplasty to 10 mm using a 10 x 40 mm Conquest balloon. The inflation was maintained at 20 atmospheres with full effacement for 2 minutes. Following angioplasty, the balloon was removed. The 6 French Angiojet device was then inserted over the [place] wire and rheolytic thrombectomy/thrombolysis was performed throughout the region of the fistula with internal thrombus. The Angiojet was then removed and a 7 x 40 mm Conquest balloon was advanced through the sheath an used to further balloon macerate and angioplasty the stick zone and proximal draining vein.  Following this, aspiration was performed through the sheath. There was blood flow however it was extremely week. Therefore, using similar technique to above, ultrasound-guided puncture of the draining vein in retrograde fashion directed toward the arterial anastomosis was performed with a 21 gauge micropuncture needle. An image was obtained and stored for the medical record. Using the transitional micro sheath, a working 6 [place] vascular sheath was advanced into the vein. A Kumpe the catheter and Glidewire were used to advanced into the arterial anastomosis. A gentle hand injection of contrast material demonstrates stasis within the inflow artery secondary to the 5 French catheter being occlusive. This was confirmed is the catheter was gently removed. Once back within the main draining vein the contrast column slowly washed out into the fistula. There is no distal flow which represents an  interval change compared to October of 2014. Unfortunately, the inflow radial artery is extremely atretic. There is no outflow distal to the stenosis. The vessel measures approximately 2 mm in diameter.  Attempts were made to gently catheterize the very small radial artery, however the 5 French catheter was occlusive and a wire could not make the 90 degree bend. Given the very poor arterial inflow, the chance of obtaining sustained patency of this fistula is near 0. Therefore, further intervention was aborted.  Attention was turned to the right neck for placement of a tunneled hemodialysis catheter.  The right neck was interrogated with ultrasound. The right internal jugular vein is atretic and thick walled and cannot be visualized directly to the subclavian vein. The subclavian vein and external jugular vein are slightly hyper trophic. 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 As the subclavian vein was easily visible from a supraclavicular approach, it was decided to place the catheter directly into this vessel. Local anesthesia was attained by infiltration with 1% lidocaine. A small dermatotomy was made. Under real-time sonographic guidance (an image was obtained and stored for the medical  record) the vessels punctured with a 21 gauge micropuncture needle. With the assistance of a 5 French micro sheath, a 0.018 inch wire was advanced into the heart in the intravascular depth measured. The micro wire was then removed and a 0.035 inch wire was advanced through the heart and into the inferior vena cava.  A suitable skin exit site inferior to the clavicle was selected. Local anesthesia was attained by 1% lidocaine infiltration. A small dermatotomy was made. A 19 cm tip to cuff Bard hemo split hemodialysis catheter was then tunneled from the skin exit site to the dermatotomy overlying the venous access site. A peel-away sheath was then advanced over the Bentson wire and into the right heart. The tunneled  hemodialysis catheter was then fed through the peel-away sheath and the tips positioned in the upper right atrium under fluoroscopic imaging. An image was obtained and stored.  The catheter flushed and aspirated with ease and was locked with heparinized saline. The catheter was then secured to the skin with 0 Prolene suture and a sterile bandage. The dermatotomy over the venous access site was sealed with Dermabond. The patient tolerated the procedure well.  COMPLICATIONS: None  IMPRESSION: 1. 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 2. Unsuccessful declot procedure secondary to very poor arterial inflow from a small, atherosclerotic and atretic radial artery. 3. Successful placement of a right IJ approach 19 cm tip to cuff tunneled hemodialysis catheter. Catheter tips are in the upper right atrium and ready for immediate use.  PLAN: 1. Recommend referral to vascular surgery for revision of the right upper extremity arteriovenous fistula. Signed,  Sterling Big, MD  [facility]  [facility] Radiology   Electronically Signed   By: Malachy Moan M.D.   On: 07/15/2014 17:16   Ir US Guide Vasc Access Right  07/15/2014   CLINICAL DATA:  54 year old male with end-stage renal disease on hemodialysis. His right forearm  EXAM: IR RIGHT DECLOT; PTA VENOUS; ARTERIOVENOUS SHUNT FOR DIALYSIS; ADDITIONAL ACCESS; IR ULTRASOUND GUIDANCE VASC ACCESS RIGHT; IR RIGHT FLOURO GUIDE CV LINE  Date: 07/15/2014  PROCEDURE: 1. Ultrasound-guided antegrade puncture adjacent to the arterial anastomosis 2. Ultrasound-guided retrograde puncture 3. Catheterization of the right subclavian vein with central and pull-back venogram 4. 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 Pharmacomechanical thrombolysis/thrombectomy declot procedure 6. Attempted catheterization of the arterial inflow with limited arteriogram 7. Ultrasound-guided puncture of the right subclavian vein from  a supraclavicular approach 8. Placement of a 19 cm tip to cuff tunneled hemodialysis catheter Interventional Radiologist:  Sterling Big, MD  ANESTHESIA/SEDATION: Moderate (conscious) sedation was used. 2 mg Versed, 100 mcg Fentanyl were administered intravenously. The patient's vital signs were monitored continuously by radiology nursing throughout the procedure.  Sedation Time: 80 minutes  MEDICATIONS: 2 g Ancef and 3000 units heparin administered intravenously. Ancef was administered within 1 hours of skin incision.  FLUOROSCOPY TIME:  15 minutes 30 seconds  CONTRAST:  30mL OMNIPAQUE IOHEXOL 300 MG/ML  SOLN  TECHNIQUE: Informed consent was obtained from the patient following explanation of the procedure, risks, benefits and alternatives. The patient understands, agrees and consents for the procedure. All questions were addressed. A time out was performed.  Maximal barrier sterile technique utilized including caps, mask, sterile gowns, sterile gloves, large sterile drape, hand hygiene, and Betadine skin prep.  The right upper extremity arteriovenous fistula was interrogated with ultrasound. There is subocclusive clot throughout the main stick zone. The vein proximal and  distal to this is patent and compressible. However, there is a very weak pulse suggesting possible inflow disease versus high-grade central stenosis.  Local anesthesia was attained by infiltration with 1% lidocaine. Under real-time sonographic guidance, the vein was punctured in antegrade fashion several cm from the arterial anastomosis. A transitional 5 French micro sheath was advanced over a micro wire into the stick zone. 2 mg of tPA was then injected into the thrombus. This was manually macerated by gentle massage while the outflow vein was capped compressed with the ultrasound probe. 3000 units of heparin was injected intravenously.  The transitional micro sheath was then exchanged over a Bentson wire for a working 7 [place] vascular  sheath. A Kumpe the catheter was advanced into the right subclavian vein and a central venogram was performed. 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 A pull-back venogram was then performed demonstrating patency of the cephalic arch and proximal cephalic vein.  A Rosen wire was next advanced into the superior vena cava and the subclavian vein was angioplasty to 10 mm using a 10 x 40 mm Conquest balloon. The inflation was maintained at 20 atmospheres with full effacement for 2 minutes. Following angioplasty, the balloon was removed. The 6 French Angiojet device was then inserted over the [place] wire and rheolytic thrombectomy/thrombolysis was performed throughout the region of the fistula with internal thrombus. The Angiojet was then removed and a 7 x 40 mm Conquest balloon was advanced through the sheath an used to further balloon macerate and angioplasty the stick zone and proximal draining vein.  Following this, aspiration was performed through the sheath. There was blood flow however it was extremely week. Therefore, using similar technique to above, ultrasound-guided puncture of the draining vein in retrograde fashion directed toward the arterial anastomosis was performed with a 21 gauge micropuncture needle. An image was obtained and stored for the medical record. Using the transitional micro sheath, a working 6 [place] vascular sheath was advanced into the vein. A Kumpe the catheter and Glidewire were used to advanced into the arterial anastomosis. A gentle hand injection of contrast material demonstrates stasis within the inflow artery secondary to the 5 French catheter being occlusive. This was confirmed is the catheter was gently removed. Once back within the main draining vein the contrast column slowly washed out into the fistula. There is no distal flow which represents an interval change compared to October of 2014. Unfortunately, the  inflow radial artery is extremely atretic. There is no outflow distal to the stenosis. The vessel measures approximately 2 mm in diameter.  Attempts were made to gently catheterize the very small radial artery, however the 5 French catheter was occlusive and a wire could not make the 90 degree bend. Given the very poor arterial inflow, the chance of obtaining sustained patency of this fistula is near 0. Therefore, further intervention was aborted.  Attention was turned to the right neck for placement of a tunneled hemodialysis catheter.  The right neck was interrogated with ultrasound. The right internal jugular vein is atretic and thick walled and cannot be visualized directly to the subclavian vein. The subclavian vein and external jugular vein are slightly hyper trophic. 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 As the subclavian vein was easily visible from a supraclavicular approach, it was decided to place the catheter directly into this vessel. Local anesthesia was attained by infiltration with 1% lidocaine. A small dermatotomy was made.  Under real-time sonographic guidance (an image was obtained and stored for the medical record) the vessels punctured with a 21 gauge micropuncture needle. With the assistance of a 5 French micro sheath, a 0.018 inch wire was advanced into the heart in the intravascular depth measured. The micro wire was then removed and a 0.035 inch wire was advanced through the heart and into the inferior vena cava.  A suitable skin exit site inferior to the clavicle was selected. Local anesthesia was attained by 1% lidocaine infiltration. A small dermatotomy was made. A 19 cm tip to cuff Bard hemo split hemodialysis catheter was then tunneled from the skin exit site to the dermatotomy overlying the venous access site. A peel-away sheath was then advanced over the Bentson wire and into the right heart. The tunneled hemodialysis catheter was then fed through the peel-away sheath and the  tips positioned in the upper right atrium under fluoroscopic imaging. An image was obtained and stored.  The catheter flushed and aspirated with ease and was locked with heparinized saline. The catheter was then secured to the skin with 0 Prolene suture and a sterile bandage. The dermatotomy over the venous access site was sealed with Dermabond. The patient tolerated the procedure well.  COMPLICATIONS: None  IMPRESSION: 1. 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 2. Unsuccessful declot procedure secondary to very poor arterial inflow from a small, atherosclerotic and atretic radial artery. 3. Successful placement of a right IJ approach 19 cm tip to cuff tunneled hemodialysis catheter. Catheter tips are in the upper right atrium and ready for immediate use.  PLAN: 1. Recommend referral to vascular surgery for revision of the right upper extremity arteriovenous fistula. Signed,  Sterling BigHeath K. McCullough, MD  [facility]  [facility]   Electronically Signed   By: Malachy MoanHeath  McCullough M.D.   On: 07/15/2014 17:16   Ir [place] Guide Vasc Access Right  07/15/2014   CLINICAL DATA:  54 year old male with end-stage renal disease on hemodialysis. His right forearm  EXAM: IR RIGHT DECLOT; PTA VENOUS; ARTERIOVENOUS SHUNT FOR DIALYSIS; ADDITIONAL ACCESS; IR ULTRASOUND GUIDANCE VASC ACCESS RIGHT; IR RIGHT FLOURO GUIDE CV LINE  Date: 07/15/2014  PROCEDURE: 1. Ultrasound-guided antegrade puncture adjacent to the arterial anastomosis 2. Ultrasound-guided retrograde puncture 3. Catheterization of the right subclavian vein with central and pull-back venogram 4. 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 Pharmacomechanical thrombolysis/thrombectomy declot procedure 6. Attempted catheterization of the arterial inflow with limited arteriogram 7. Ultrasound-guided puncture of the right subclavian vein from a supraclavicular approach 8. Placement of a 19 cm tip to cuff tunneled  hemodialysis catheter Interventional Radiologist:  Sterling BigHeath K. McCullough, MD  ANESTHESIA/SEDATION: Moderate (conscious) sedation was used. 2 mg Versed, 100 mcg Fentanyl were administered intravenously. The patient's vital signs were monitored continuously by radiology nursing throughout the procedure.  Sedation Time: 80 minutes  MEDICATIONS: 2 g Ancef and 3000 units heparin administered intravenously. Ancef was administered within 1 hours of skin incision.  FLUOROSCOPY TIME:  15 minutes 30 seconds  CONTRAST:  30mL OMNIPAQUE IOHEXOL 300 MG/ML  SOLN  TECHNIQUE: Informed consent was obtained from the patient following explanation of the procedure, risks, benefits and alternatives. The patient understands, agrees and consents for the procedure. All questions were addressed. A time out was performed.  Maximal barrier sterile technique utilized including caps, mask, sterile gowns, sterile gloves, large sterile drape, hand hygiene, and Betadine skin prep.  The right upper extremity arteriovenous fistula was interrogated with ultrasound.  There is subocclusive clot throughout the main stick zone. The vein proximal and distal to this is patent and compressible. However, there is a very weak pulse suggesting possible inflow disease versus high-grade central stenosis.  Local anesthesia was attained by infiltration with 1% lidocaine. Under real-time sonographic guidance, the vein was punctured in antegrade fashion several cm from the arterial anastomosis. A transitional 5 French micro sheath was advanced over a micro wire into the stick zone. 2 mg of tPA was then injected into the thrombus. This was manually macerated by gentle massage while the outflow vein was capped compressed with the ultrasound probe. 3000 units of heparin was injected intravenously.  The transitional micro sheath was then exchanged over a Bentson wire for a working 7 [place] vascular sheath. A Kumpe the catheter was advanced into the right subclavian vein and  a central venogram was performed. 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 A pull-back venogram was then performed demonstrating patency of the cephalic arch and proximal cephalic vein.  A Rosen wire was next advanced into the superior vena cava and the subclavian vein was angioplasty to 10 mm using a 10 x 40 mm Conquest balloon. The inflation was maintained at 20 atmospheres with full effacement for 2 minutes. Following angioplasty, the balloon was removed. The 6 French Angiojet device was then inserted over the [place] wire and rheolytic thrombectomy/thrombolysis was performed throughout the region of the fistula with internal thrombus. The Angiojet was then removed and a 7 x 40 mm Conquest balloon was advanced through the sheath an used to further balloon macerate and angioplasty the stick zone and proximal draining vein.  Following this, aspiration was performed through the sheath. There was blood flow however it was extremely week. Therefore, using similar technique to above, ultrasound-guided puncture of the draining vein in retrograde fashion directed toward the arterial anastomosis was performed with a 21 gauge micropuncture needle. An image was obtained and stored for the medical record. Using the transitional micro sheath, a working 6 [place] vascular sheath was advanced into the vein. A Kumpe the catheter and Glidewire were used to advanced into the arterial anastomosis. A gentle hand injection of contrast material demonstrates stasis within the inflow artery secondary to the 5 French catheter being occlusive. This was confirmed is the catheter was gently removed. Once back within the main draining vein the contrast column slowly washed out into the fistula. There is no distal flow which represents an interval change compared to October of 2014. Unfortunately, the inflow radial artery is extremely atretic. There is no outflow distal to the  stenosis. The vessel measures approximately 2 mm in diameter.  Attempts were made to gently catheterize the very small radial artery, however the 5 French catheter was occlusive and a wire could not make the 90 degree bend. Given the very poor arterial inflow, the chance of obtaining sustained patency of this fistula is near 0. Therefore, further intervention was aborted.  Attention was turned to the right neck for placement of a tunneled hemodialysis catheter.  The right neck was interrogated with ultrasound. The right internal jugular vein is atretic and thick walled and cannot be visualized directly to the subclavian vein. The subclavian vein and external jugular vein are slightly hyper trophic. 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 As the subclavian vein was easily visible from a supraclavicular approach, it was decided to place the catheter directly into this vessel. Local  anesthesia was attained by infiltration with 1% lidocaine. A small dermatotomy was made. Under real-time sonographic guidance (an image was obtained and stored for the medical record) the vessels punctured with a 21 gauge micropuncture needle. With the assistance of a 5 French micro sheath, a 0.018 inch wire was advanced into the heart in the intravascular depth measured. The micro wire was then removed and a 0.035 inch wire was advanced through the heart and into the inferior vena cava.  A suitable skin exit site inferior to the clavicle was selected. Local anesthesia was attained by 1% lidocaine infiltration. A small dermatotomy was made. A 19 cm tip to cuff Bard hemo split hemodialysis catheter was then tunneled from the skin exit site to the dermatotomy overlying the venous access site. A peel-away sheath was then advanced over the Bentson wire and into the right heart. The tunneled hemodialysis catheter was then fed through the peel-away sheath and the tips positioned in the upper right atrium under fluoroscopic imaging. An  image was obtained and stored.  The catheter flushed and aspirated with ease and was locked with heparinized saline. The catheter was then secured to the skin with 0 Prolene suture and a sterile bandage. The dermatotomy over the venous access site was sealed with Dermabond. The patient tolerated the procedure well.  COMPLICATIONS: None  IMPRESSION: 1. 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 2. Unsuccessful declot procedure secondary to very poor arterial inflow from a small, atherosclerotic and atretic radial artery. 3. Successful placement of a right IJ approach 19 cm tip to cuff tunneled hemodialysis catheter. Catheter tips are in the upper right atrium and ready for immediate use.  PLAN: 1. Recommend referral to vascular surgery for revision of the right upper extremity arteriovenous fistula. Signed,  Sterling Big, MD  [facility]  [facility]   Electronically Signed   By: Malachy Moan M.D.   On: 07/15/2014 17:16   Dg Chest Port 1 View  07/15/2014   CLINICAL DATA:  Preoperative exam prior to declining procedure for right arm dialysis fistula  EXAM: PORTABLE CHEST - 1 VIEW  COMPARISON:  PA and lateral chest x-ray of July 13, 2014  FINDINGS: The lungs are reasonably well inflated and clear. The cardiac silhouette is top-normal in size. The pulmonary vascularity is within the limits of normal. There is no pleural effusion. The mediastinum is mildly widened though stable. The trachea is midline. The bony thorax is unremarkable.  IMPRESSION: Mild stable enlargement of the cardiac silhouette. There is no active cardiopulmonary disease.   Electronically Signed   By: David  [place]   On: 07/15/2014 07:30   Ir Declot Right Mod Sed  07/15/2014   CLINICAL DATA:  54 year old male with end-stage renal disease on hemodialysis. His right forearm  EXAM: IR RIGHT DECLOT; PTA VENOUS; ARTERIOVENOUS SHUNT FOR DIALYSIS; ADDITIONAL ACCESS; IR  ULTRASOUND GUIDANCE VASC ACCESS RIGHT; IR RIGHT FLOURO GUIDE CV LINE  Date: 07/15/2014  PROCEDURE: 1. Ultrasound-guided antegrade puncture adjacent to the arterial anastomosis 2. Ultrasound-guided retrograde puncture 3. Catheterization of the right subclavian vein with central and pull-back venogram 4. 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 Pharmacomechanical thrombolysis/thrombectomy declot procedure 6. Attempted catheterization of the arterial inflow with limited arteriogram 7. Ultrasound-guided puncture of the right subclavian vein from a supraclavicular approach 8. Placement of a 19 cm tip to cuff tunneled hemodialysis catheter Interventional Radiologist:  Sterling Big, MD  ANESTHESIA/SEDATION: Moderate (conscious) sedation was used. 2  mg Versed, 100 mcg Fentanyl were administered intravenously. The patient's vital signs were monitored continuously by radiology nursing throughout the procedure.  Sedation Time: 80 minutes  MEDICATIONS: 2 g Ancef and 3000 units heparin administered intravenously. Ancef was administered within 1 hours of skin incision.  FLUOROSCOPY TIME:  15 minutes 30 seconds  CONTRAST:  30mL OMNIPAQUE IOHEXOL 300 MG/ML  SOLN  TECHNIQUE: Informed consent was obtained from the patient following explanation of the procedure, risks, benefits and alternatives. The patient understands, agrees and consents for the procedure. All questions were addressed. A time out was performed.  Maximal barrier sterile technique utilized including caps, mask, sterile gowns, sterile gloves, large sterile drape, hand hygiene, and Betadine skin prep.  The right upper extremity arteriovenous fistula was interrogated with ultrasound. There is subocclusive clot throughout the main stick zone. The vein proximal and distal to this is patent and compressible. However, there is a very weak pulse suggesting possible inflow disease versus high-grade central stenosis.  Local anesthesia was attained by  infiltration with 1% lidocaine. Under real-time sonographic guidance, the vein was punctured in antegrade fashion several cm from the arterial anastomosis. A transitional 5 French micro sheath was advanced over a micro wire into the stick zone. 2 mg of tPA was then injected into the thrombus. This was manually macerated by gentle massage while the outflow vein was capped compressed with the ultrasound probe. 3000 units of heparin was injected intravenously.  The transitional micro sheath was then exchanged over a Bentson wire for a working 7 [place] vascular sheath. A Kumpe the catheter was advanced into the right subclavian vein and a central venogram was performed. 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 A pull-back venogram was then performed demonstrating patency of the cephalic arch and proximal cephalic vein.  A Rosen wire was next advanced into the superior vena cava and the subclavian vein was angioplasty to 10 mm using a 10 x 40 mm Conquest balloon. The inflation was maintained at 20 atmospheres with full effacement for 2 minutes. Following angioplasty, the balloon was removed. The 6 French Angiojet device was then inserted over the Lyman wire and rheolytic thrombectomy/thrombolysis was performed throughout the region of the fistula with internal thrombus. The Angiojet was then removed and a 7 x 40 mm Conquest balloon was advanced through the sheath an used to further balloon macerate and angioplasty the stick zone and proximal draining vein.  Following this, aspiration was performed through the sheath. There was blood flow however it was extremely week. Therefore, using similar technique to above, ultrasound-guided puncture of the draining vein in retrograde fashion directed toward the arterial anastomosis was performed with a 21 gauge micropuncture needle. An image was obtained and stored for the medical record. Using the transitional  micro sheath, a working 6 [place] vascular sheath was advanced into the vein. A Kumpe the catheter and Glidewire were used to advanced into the arterial anastomosis. A gentle hand injection of contrast material demonstrates stasis within the inflow artery secondary to the 5 French catheter being occlusive. This was confirmed is the catheter was gently removed. Once back within the main draining vein the contrast column slowly washed out into the fistula. There is no distal flow which represents an interval change compared to October of 2014. Unfortunately, the inflow radial artery is extremely atretic. There is no outflow distal to the stenosis. The vessel measures approximately 2 mm in diameter.  Attempts were  made to gently catheterize the very small radial artery, however the 5 French catheter was occlusive and a wire could not make the 90 degree bend. Given the very poor arterial inflow, the chance of obtaining sustained patency of this fistula is near 0. Therefore, further intervention was aborted.  Attention was turned to the right neck for placement of a tunneled hemodialysis catheter.  The right neck was interrogated with ultrasound. The right internal jugular vein is atretic and thick walled and cannot be visualized directly to the subclavian vein. The subclavian vein and external jugular vein are slightly hyper trophic. 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 As the subclavian vein was easily visible from a supraclavicular approach, it was decided to place the catheter directly into this vessel. Local anesthesia was attained by infiltration with 1% lidocaine. A small dermatotomy was made. Under real-time sonographic guidance (an image was obtained and stored for the medical record) the vessels punctured with a 21 gauge micropuncture needle. With the assistance of a 5 French micro sheath, a 0.018 inch wire was advanced into the heart in the intravascular depth measured. The micro wire was then  removed and a 0.035 inch wire was advanced through the heart and into the inferior vena cava.  A suitable skin exit site inferior to the clavicle was selected. Local anesthesia was attained by 1% lidocaine infiltration. A small dermatotomy was made. A 19 cm tip to cuff Bard hemo split hemodialysis catheter was then tunneled from the skin exit site to the dermatotomy overlying the venous access site. A peel-away sheath was then advanced over the Bentson wire and into the right heart. The tunneled hemodialysis catheter was then fed through the peel-away sheath and the tips positioned in the upper right atrium under fluoroscopic imaging. An image was obtained and stored.  The catheter flushed and aspirated with ease and was locked with heparinized saline. The catheter was then secured to the skin with 0 Prolene suture and a sterile bandage. The dermatotomy over the venous access site was sealed with Dermabond. The patient tolerated the procedure well.  COMPLICATIONS: None  IMPRESSION: 1. 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 2. Unsuccessful declot procedure secondary to very poor arterial inflow from a small, atherosclerotic and atretic radial artery. 3. Successful placement of a right IJ approach 19 cm tip to cuff tunneled hemodialysis catheter. Catheter tips are in the upper right atrium and ready for immediate use.  PLAN: 1. Recommend referral to vascular surgery for revision of the right upper extremity arteriovenous fistula. Signed,  Sterling Big, MD  [facility]  [facility]   Electronically Signed   By: Malachy Moan M.D.   On: 07/15/2014 17:16    Scheduled: . sodium chloride   Intravenous Once  . sodium chloride   Intravenous Once  . amLODipine  10 mg Oral Daily  . [START ON 07/17/2014] cefUROXime (ZINACEF)  IV  1.5 g Intravenous On Call to OR  . heparin subcutaneous  5,000 Units Subcutaneous 3 times per day  . insulin aspart   0-9 Units Subcutaneous 6 times per day  . lisinopril  20 mg Oral Daily  . sodium chloride  3 mL Intravenous Q12H       LOS: 3 days   Caelyn Route C 07/16/2014,12:10 PM

## 2014-07-17 ENCOUNTER — Inpatient Hospital Stay (HOSPITAL_COMMUNITY): Payer: Medicaid Other | Admitting: Anesthesiology

## 2014-07-17 ENCOUNTER — Encounter (HOSPITAL_COMMUNITY)
Admission: EM | Disposition: A | Discharge status: Home / Self Care | Payer: Self-pay | Source: Home / Self Care | Attending: Internal Medicine

## 2014-07-17 ENCOUNTER — Encounter (HOSPITAL_COMMUNITY): Payer: Self-pay | Admitting: Anesthesiology

## 2014-07-17 DIAGNOSIS — Z992 Dependence on renal dialysis: Secondary | ICD-10-CM

## 2014-07-17 DIAGNOSIS — J849 Interstitial pulmonary disease, unspecified: Secondary | ICD-10-CM

## 2014-07-17 DIAGNOSIS — E872 Acidosis: Secondary | ICD-10-CM

## 2014-07-17 DIAGNOSIS — K850 Idiopathic acute pancreatitis: Secondary | ICD-10-CM

## 2014-07-17 DIAGNOSIS — Z452 Encounter for adjustment and management of vascular access device: Secondary | ICD-10-CM

## 2014-07-17 DIAGNOSIS — R531 Weakness: Secondary | ICD-10-CM

## 2014-07-17 HISTORY — PX: AV FISTULA PLACEMENT: SHX1204

## 2014-07-17 LAB — PROTIME-INR
INR: 1.1 (ref 0.00–1.49)
Prothrombin Time: 14.3 seconds (ref 11.6–15.2)

## 2014-07-17 LAB — GLUCOSE, CAPILLARY
GLUCOSE-CAPILLARY: 161 mg/dL — AB (ref 70–99)
Glucose-Capillary: 105 mg/dL — ABNORMAL HIGH (ref 70–99)
Glucose-Capillary: 143 mg/dL — ABNORMAL HIGH (ref 70–99)
Glucose-Capillary: 170 mg/dL — ABNORMAL HIGH (ref 70–99)
Glucose-Capillary: 172 mg/dL — ABNORMAL HIGH (ref 70–99)
Glucose-Capillary: 207 mg/dL — ABNORMAL HIGH (ref 70–99)

## 2014-07-17 LAB — CBC
HCT: 23.9 % — ABNORMAL LOW (ref 39.0–52.0)
Hemoglobin: 8.2 g/dL — ABNORMAL LOW (ref 13.0–17.0)
MCH: 29.3 pg (ref 26.0–34.0)
MCHC: 34.3 g/dL (ref 30.0–36.0)
MCV: 85.4 fL (ref 78.0–100.0)
PLATELETS: 157 10*3/uL (ref 150–400)
RBC: 2.8 MIL/uL — AB (ref 4.22–5.81)
RDW: 13.3 % (ref 11.5–15.5)
WBC: 8.4 10*3/uL (ref 4.0–10.5)

## 2014-07-17 LAB — COMPREHENSIVE METABOLIC PANEL
ALT: 67 U/L — ABNORMAL HIGH (ref 0–53)
AST: 158 U/L — ABNORMAL HIGH (ref 0–37)
Albumin: 2.5 g/dL — ABNORMAL LOW (ref 3.5–5.2)
Alkaline Phosphatase: 246 U/L — ABNORMAL HIGH (ref 39–117)
Anion gap: 8 (ref 5–15)
BUN: 43 mg/dL — ABNORMAL HIGH (ref 6–23)
CALCIUM: 7.9 mg/dL — AB (ref 8.4–10.5)
CO2: 24 mmol/L (ref 19–32)
CREATININE: 10.21 mg/dL — AB (ref 0.50–1.35)
Chloride: 101 mmol/L (ref 96–112)
GFR, EST AFRICAN AMERICAN: 6 mL/min — AB (ref >90)
GFR, EST NON AFRICAN AMERICAN: 5 mL/min — AB (ref >90)
GLUCOSE: 167 mg/dL — AB (ref 70–99)
Potassium: 3.8 mmol/L (ref 3.5–5.1)
SODIUM: 133 mmol/L — AB (ref 135–145)
Total Bilirubin: 1 mg/dL (ref 0.3–1.2)
Total Protein: 5.7 g/dL — ABNORMAL LOW (ref 6.0–8.3)

## 2014-07-17 LAB — HEPATITIS B CORE ANTIBODY, TOTAL: HEP B C TOTAL AB: NEGATIVE

## 2014-07-17 LAB — HEPATITIS B SURFACE ANTIBODY,QUALITATIVE: HEP B S AB: REACTIVE

## 2014-07-17 LAB — SURGICAL PCR SCREEN
MRSA, PCR: NEGATIVE
Staphylococcus aureus: NEGATIVE

## 2014-07-17 SURGERY — INSERTION OF ARTERIOVENOUS (AV) GORE-TEX GRAFT THIGH
Anesthesia: General | Site: Leg Upper | Laterality: Left

## 2014-07-17 MED ORDER — SODIUM CHLORIDE 0.9 % IJ SOLN
INTRAMUSCULAR | Status: AC
  Filled 2014-07-17: qty 10

## 2014-07-17 MED ORDER — ROCURONIUM BROMIDE 50 MG/5ML IV SOLN
INTRAVENOUS | Status: AC
  Filled 2014-07-17: qty 1

## 2014-07-17 MED ORDER — FENTANYL CITRATE 0.05 MG/ML IJ SOLN
INTRAMUSCULAR | Status: AC
  Filled 2014-07-17: qty 5

## 2014-07-17 MED ORDER — LIDOCAINE HCL (PF) 1 % IJ SOLN: 5.0000 mL | INTRAMUSCULAR | Status: DC | PRN

## 2014-07-17 MED ORDER — SUCCINYLCHOLINE CHLORIDE 20 MG/ML IJ SOLN
INTRAMUSCULAR | Status: AC
  Filled 2014-07-17: qty 1

## 2014-07-17 MED ORDER — SODIUM CHLORIDE 0.9 % IV SOLN
INTRAVENOUS | Status: DC
  Administered 2014-07-17: 08:00:00 via INTRAVENOUS

## 2014-07-17 MED ORDER — MIDAZOLAM HCL 2 MG/2ML IJ SOLN
INTRAMUSCULAR | Status: AC
  Filled 2014-07-17: qty 2

## 2014-07-17 MED ORDER — LIDOCAINE HCL (CARDIAC) 20 MG/ML IV SOLN
INTRAVENOUS | Status: DC | PRN
  Administered 2014-07-17: 60 mg via INTRAVENOUS

## 2014-07-17 MED ORDER — MORPHINE SULFATE 2 MG/ML IJ SOLN: 1.0000 mg | INTRAMUSCULAR | Status: DC | PRN

## 2014-07-17 MED ORDER — EPHEDRINE SULFATE 50 MG/ML IJ SOLN
INTRAMUSCULAR | Status: AC
  Filled 2014-07-17: qty 1

## 2014-07-17 MED ORDER — MUPIROCIN 2 % EX OINT
TOPICAL_OINTMENT | CUTANEOUS | Status: AC
  Filled 2014-07-17: qty 22

## 2014-07-17 MED ORDER — NEPRO/CARBSTEADY PO LIQD: 237.0000 mL | ORAL | Status: DC | PRN

## 2014-07-17 MED ORDER — ALTEPLASE 2 MG IJ SOLR
2.0000 mg | Freq: Once | INTRAMUSCULAR | Status: AC | PRN
  Filled 2014-07-17: qty 2

## 2014-07-17 MED ORDER — HYDROMORPHONE HCL 1 MG/ML IJ SOLN
INTRAMUSCULAR | Status: AC
  Filled 2014-07-17: qty 1

## 2014-07-17 MED ORDER — PROPOFOL 10 MG/ML IV BOLUS
INTRAVENOUS | Status: DC | PRN
  Administered 2014-07-17: 100 mg via INTRAVENOUS

## 2014-07-17 MED ORDER — SODIUM CHLORIDE 0.9 % IV SOLN: 100.0000 mL | INTRAVENOUS | Status: DC | PRN

## 2014-07-17 MED ORDER — MIDAZOLAM HCL 5 MG/5ML IJ SOLN
INTRAMUSCULAR | Status: DC | PRN
  Administered 2014-07-17 (×2): 1 mg via INTRAVENOUS

## 2014-07-17 MED ORDER — HEPARIN SODIUM (PORCINE) 1000 UNIT/ML DIALYSIS: 1000.0000 [IU] | INTRAMUSCULAR | Status: DC | PRN

## 2014-07-17 MED ORDER — MUPIROCIN 2 % EX OINT
1.0000 "application " | TOPICAL_OINTMENT | Freq: Once | CUTANEOUS | Status: AC
  Administered 2014-07-17: 1 via TOPICAL

## 2014-07-17 MED ORDER — PHENYLEPHRINE HCL 10 MG/ML IJ SOLN
INTRAMUSCULAR | Status: AC
  Filled 2014-07-17: qty 1

## 2014-07-17 MED ORDER — EPHEDRINE SULFATE 50 MG/ML IJ SOLN
INTRAMUSCULAR | Status: DC | PRN
  Administered 2014-07-17: 5 mg via INTRAVENOUS
  Administered 2014-07-17: 10 mg via INTRAVENOUS

## 2014-07-17 MED ORDER — ARTIFICIAL TEARS OP OINT
TOPICAL_OINTMENT | OPHTHALMIC | Status: AC
  Filled 2014-07-17: qty 3.5

## 2014-07-17 MED ORDER — LIDOCAINE HCL (CARDIAC) 20 MG/ML IV SOLN
INTRAVENOUS | Status: AC
  Filled 2014-07-17: qty 10

## 2014-07-17 MED ORDER — PROPOFOL 10 MG/ML IV BOLUS
INTRAVENOUS | Status: AC
  Filled 2014-07-17: qty 20

## 2014-07-17 MED ORDER — PENTAFLUOROPROP-TETRAFLUOROETH EX AERO
1.0000 | INHALATION_SPRAY | CUTANEOUS | Status: DC | PRN
Start: 2014-07-17 — End: 2014-07-17

## 2014-07-17 MED ORDER — FENTANYL CITRATE 0.05 MG/ML IJ SOLN
INTRAMUSCULAR | Status: DC | PRN
  Administered 2014-07-17 (×3): 50 ug via INTRAVENOUS

## 2014-07-17 MED ORDER — OXYCODONE HCL 5 MG/5ML PO SOLN: 5.0000 mg | Freq: Once | ORAL | Status: DC | PRN

## 2014-07-17 MED ORDER — OXYCODONE-ACETAMINOPHEN 5-325 MG PO TABS
1.0000 | ORAL_TABLET | Freq: Four times a day (QID) | ORAL | Status: DC | PRN
  Administered 2014-07-18: 1 via ORAL
  Filled 2014-07-17: qty 1

## 2014-07-17 MED ORDER — 0.9 % SODIUM CHLORIDE (POUR BTL) OPTIME
TOPICAL | Status: DC | PRN
  Administered 2014-07-17: 1000 mL

## 2014-07-17 MED ORDER — SODIUM CHLORIDE 0.9 % IR SOLN
Status: DC | PRN
  Administered 2014-07-17: 500 mL

## 2014-07-17 MED ORDER — OXYCODONE HCL 5 MG PO TABS: 5.0000 mg | ORAL_TABLET | Freq: Once | ORAL | Status: DC | PRN

## 2014-07-17 MED ORDER — HYDROMORPHONE HCL 1 MG/ML IJ SOLN
0.2500 mg | INTRAMUSCULAR | Status: DC | PRN
  Administered 2014-07-17: 0.25 mg via INTRAVENOUS

## 2014-07-17 MED ORDER — LIDOCAINE-PRILOCAINE 2.5-2.5 % EX CREA: 1.0000 "application " | TOPICAL_CREAM | CUTANEOUS | Status: DC | PRN

## 2014-07-17 MED ORDER — HEPARIN SODIUM (PORCINE) 1000 UNIT/ML DIALYSIS
20.0000 [IU]/kg | INTRAMUSCULAR | Status: DC | PRN
Start: 2014-07-17 — End: 2014-07-17

## 2014-07-17 MED ORDER — ONDANSETRON HCL 4 MG/2ML IJ SOLN
INTRAMUSCULAR | Status: AC
  Filled 2014-07-17: qty 2

## 2014-07-17 SURGICAL SUPPLY — 43 items
CANISTER SUCTION 2500CC (MISCELLANEOUS) ×3 IMPLANT
CLIP TI MEDIUM 6 (CLIP) ×3 IMPLANT
CLIP TI WIDE RED SMALL 6 (CLIP) ×3 IMPLANT
COVER PROBE W GEL 5X96 (DRAPES) ×3 IMPLANT
COVER SURGICAL LIGHT HANDLE (MISCELLANEOUS) IMPLANT
DRAPE INCISE IOBAN 66X45 STRL (DRAPES) ×3 IMPLANT
ELECT REM PT RETURN 9FT ADLT (ELECTROSURGICAL) ×3
ELECTRODE REM PT RTRN 9FT ADLT (ELECTROSURGICAL) ×1 IMPLANT
GAUZE SPONGE 4X4 12PLY STRL (GAUZE/BANDAGES/DRESSINGS) IMPLANT
GEL ULTRASOUND 20GR AQUASONIC (MISCELLANEOUS) IMPLANT
GLOVE BIO SURGEON STRL SZ 6.5 (GLOVE) ×2 IMPLANT
GLOVE BIO SURGEONS STRL SZ 6.5 (GLOVE) ×1
GLOVE BIOGEL PI IND STRL 6.5 (GLOVE) ×2 IMPLANT
GLOVE BIOGEL PI IND STRL 7.0 (GLOVE) ×2 IMPLANT
GLOVE BIOGEL PI IND STRL 7.5 (GLOVE) ×1 IMPLANT
GLOVE BIOGEL PI INDICATOR 6.5 (GLOVE) ×4
GLOVE BIOGEL PI INDICATOR 7.0 (GLOVE) ×4
GLOVE BIOGEL PI INDICATOR 7.5 (GLOVE) ×2
GLOVE ECLIPSE 6.5 STRL STRAW (GLOVE) ×3 IMPLANT
GLOVE ECLIPSE 7.0 STRL STRAW (GLOVE) ×3 IMPLANT
GLOVE SS BIOGEL STRL SZ 7 (GLOVE) ×1 IMPLANT
GLOVE SUPERSENSE BIOGEL SZ 7 (GLOVE) ×2
GOWN STRL REUS W/ TWL LRG LVL3 (GOWN DISPOSABLE) ×3 IMPLANT
GOWN STRL REUS W/ TWL XL LVL3 (GOWN DISPOSABLE) ×1 IMPLANT
GOWN STRL REUS W/TWL LRG LVL3 (GOWN DISPOSABLE) ×6
GOWN STRL REUS W/TWL XL LVL3 (GOWN DISPOSABLE) ×2
GRAFT GORETEX 6X40 (Vascular Products) ×3 IMPLANT
KIT BASIN OR (CUSTOM PROCEDURE TRAY) ×3 IMPLANT
KIT ROOM TURNOVER OR (KITS) ×3 IMPLANT
LIQUID BAND (GAUZE/BANDAGES/DRESSINGS) ×3 IMPLANT
LOOP VESSEL MINI RED (MISCELLANEOUS) ×3 IMPLANT
NS IRRIG 1000ML POUR BTL (IV SOLUTION) ×3 IMPLANT
PACK CV ACCESS (CUSTOM PROCEDURE TRAY) ×3 IMPLANT
PAD ARMBOARD 7.5X6 YLW CONV (MISCELLANEOUS) ×6 IMPLANT
PROBE PENCIL 8 MHZ STRL DISP (MISCELLANEOUS) IMPLANT
SUT PROLENE 6 0 BV (SUTURE) ×6 IMPLANT
SUT VIC AB 2-0 CT1 27 (SUTURE) ×2
SUT VIC AB 2-0 CT1 TAPERPNT 27 (SUTURE) ×1 IMPLANT
SUT VIC AB 3-0 SH 27 (SUTURE) ×4
SUT VIC AB 3-0 SH 27X BRD (SUTURE) ×2 IMPLANT
TAPE STRIPS DRAPE STRL (GAUZE/BANDAGES/DRESSINGS) ×3 IMPLANT
UNDERPAD 30X30 INCONTINENT (UNDERPADS AND DIAPERS) ×3 IMPLANT
WATER STERILE IRR 1000ML POUR (IV SOLUTION) ×3 IMPLANT

## 2014-07-17 NOTE — H&P (View-Only) (Signed)
Vascular Surgery Consultation  Reason for Consult: End-stage renal disease with clotted right arm AV fistula  HPI: Theodore Hurley is a 53 y.o. male who presents for evaluation of needs vascular access. Patient has been on hemodialysis for 14 years. Initially he had left arm access and eventually occluded left upper arm graft. He has had a right arm fistula for 5+ years. It has not been functioning well recently. He had attempted thrombolyze his today which was unsuccessful. 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 Patient has never had lower extremity access. He does have a temporary catheter in the right femoral vein.   Past Medical History  Diagnosis Date  . Diabetes mellitus without complication   . Hypertension   . Stroke     slight left side weakness  . Chronic kidney disease     ESRD Dialysis MWF  . Dyslipidemia    Past Surgical History  Procedure Laterality Date  . Cholecystectomy    . Av fistula placement Right    History   Social History  . Marital Status: Married    Spouse Name: N/A  . Number of Children: N/A  . Years of Education: N/A   Social History Main Topics  . Smoking status: Never Smoker   . Smokeless tobacco: Not on file  . Alcohol Use: Not on file  . Drug Use: Not on file  . Sexual Activity: Not on file   Other Topics Concern  . None   Social History Narrative   History reviewed. No pertinent family history. No Known Allergies Prior to Admission medications   Medication Sig Start Date End Date Taking? Authorizing Provider  acetaminophen (TYLENOL) 500 MG tablet Take 500 mg by mouth every 6 (six) hours as needed.   Yes Historical Provider, MD  amLODipine (NORVASC) 10 MG tablet Take 10 mg by mouth daily.   Yes Historical Provider, MD  aspirin EC 81 MG tablet Take 81 mg by mouth daily.   Yes Historical Provider, MD   Capsaicin 0.035 % CREA Apply 1 application topically 3 (three) times daily as needed (for sore areas).   Yes Historical Provider, MD  cinacalcet (SENSIPAR) 90 MG tablet Take 90 mg by mouth daily.   Yes Historical Provider, MD  glipiZIDE (GLUCOTROL) 10 MG tablet Take 10 mg by mouth daily before breakfast.   Yes Historical Provider, MD  lanthanum (FOSRENOL) 1000 MG chewable tablet Chew 1,000-2,000 mg by mouth as directed. 2000mg with meals, 1000mg with snacks   Yes Historical Provider, MD  lisinopril (PRINIVIL,ZESTRIL) 20 MG tablet Take 20 mg by mouth daily.   Yes Historical Provider, MD  simvastatin (ZOCOR) 10 MG tablet Take 10 mg by mouth daily.   Yes Historical Provider, MD     Positive ROS: Denies chest pain but does have dyspnea on exertion due to recent fluid overload due to inadequate hemodialysis. 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 Also history of hypertension.  All other systems have been reviewed and were otherwise negative with the exception of those mentioned in the HPI and as above.  Physical Exam: Filed Vitals:   07/15/14 2000  BP: 120/62  Pulse: 67  Temp:   Resp: 10    General: Alert, no acute distress HEENT: Normal for age Cardiovascular:   Regular rate and rhythm. Carotid pulses 2+, no bruits audible Respiratory: Clear to auscultation. No cyanosis, no use of accessory musculature GI: No organomegaly, abdomen is soft and non-tender Skin: No lesions in the area of chief complaint Neurologic: Sensation intact distally Psychiatric: Patient is competent for consent with normal mood and affect Musculoskeletal: No obvious deformities Extremities: Right leg with catheter in femoral vein and 3+ popliteal pulse palpable. Right foot well-perfused Left leg with 3+ femoral and popliteal pulse palpable left foot well-perfused. Right upper extremity AV fistula in forearm  which is pulseless.  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 This was treated with balloon angioplasty and a tunneled catheter was passed through this area for dialysis purposes.   Assessment/Plan:  Patient needs left thigh AV graft for hemodialysis Insertion of any further access in the right arm would likely fail or lead to severe edema because of central vein stenosis and indwelling catheter through this area Patient is currently receiving hemodialysis this evening  Plan insertion left thigh AV graft on Friday morning at 9 AM Patient will either need repeat hemodialysis on Thursday afternoon or evening or following insertion of thigh graft on Friday. Discussed this with patient and he is agreeable to proceed   Natasja Niday, MD 07/15/2014 8:07 PM      

## 2014-07-17 NOTE — Anesthesia Postprocedure Evaluation (Signed)
  Anesthesia Post-op Note  Patient: Theodore Hurley  Procedure(s) Performed: Procedure(s): INSERTION OF ARTERIOVENOUS (AV) GORE-TEX GRAFT (326mmx40cm) LEFT THIGH (Left)  Patient Location: PACU  Anesthesia Type:General  Level of Consciousness: awake  Airway and Oxygen Therapy: Patient Spontanous Breathing  Post-op Pain: none  Post-op Assessment: Post-op Vital signs reviewed, Patient's Cardiovascular Status Stable, Respiratory Function Stable, Patent Airway, No signs of Nausea or vomiting and Pain level controlled  Post-op Vital Signs: Reviewed and stable  Last Vitals:  Filed Vitals:   07/17/14 1701  BP: 155/50  Pulse: 73  Temp: 37.4 C  Resp: 17    Complications: No apparent anesthesia complications

## 2014-07-17 NOTE — Progress Notes (Signed)
Internal medicine/hospitalist progress note     Name: Theodore Hurley MRN: [id number] DOB: [birth year]    ADMISSION DATE:  07/13/2014 CONSULTATION DATE:  07/17/2014  REFERRING MD :  Hyman Hopes  CHIEF COMPLAINT:  Hypotension, ESRD  [facility] assumed care 2/25  INITIAL PRESENTATION:  54 y.o. M w ESRD (M/W/F - last full dialysis Mon 2/15) who was in IR suite 2/22 for declotting of fistula; however, was hypotensive so sent to ED for further evaluation.  Seen by nephrology who recommended ICU admission and initiation of CRRT.   Subjective diarrhea improving Status post surgery now getting hemodialyzed   STUDIES:  CXR 2/22 >>> no acute process  SIGNIFICANT EVENTS: 2/22 - admit; CRRT 2/23 - CRRT stopped; Vanc/Ceftaz started; BP elevated - prn labetalol     VITAL SIGNS: Temp:  [97.9 F (36.6 C)-98.8 F (37.1 C)] 97.9 F (36.6 C) (02/26 1200) Pulse Rate:  [64-74] 68 (02/26 1330) Resp:  [14-19] 14 (02/26 1200) BP: (107-160)/(26-76) 116/26 mmHg (02/26 1330) SpO2:  [98 %-100 %] 98 % (02/26 1200) Weight:  [83.9 kg (184 lb 15.5 oz)-86.5 kg (190 lb 11.2 oz)] 86.5 kg (190 lb 11.2 oz) (02/26 1200) HEMODYNAMICS:   VENTILATOR SETTINGS:   INTAKE / OUTPUT:   PHYSICAL EXAMINATION: General: Hispanic male, in NAD, speaks little english. Neuro: A&O x 3, non-focal.  HEENT: [place]/AT. PERRL, sclerae anicteric. Cardiovascular: RRR, no M/R/G.  Lungs: Respirations even and unlabored.  CTA bilaterally, No W/R/R. Abdomen: BS x 4, soft, ND.  Generalized tenderness with palpation. Musculoskeletal: No gross deformities, no edema.  Skin: Intact, warm, no rashes.  LABS:  CBC  Recent Labs Lab 07/15/14 0420 07/15/14 2330 07/17/14 0500  WBC 5.2 5.8 8.4  HGB 6.6* 9.1* 8.2*  HCT 18.5* 26.5* 23.9*  PLT 130* 141* 157   Coag's  Recent Labs Lab 07/15/14 0420 07/17/14 0500  APTT 37  --   INR 1.18 1.10   BMET  Recent Labs Lab 07/14/14 1600 07/15/14 0420 07/17/14 0500  NA 133* 135 133*  K 3.3*  3.6 3.8  CL 104 101 101  CO2 19 20 24   BUN 82* 71* 43*  CREATININE 11.88* 11.88* 10.21*  GLUCOSE 195* 111* 167*   Electrolytes  Recent Labs Lab 07/14/14 0400 07/14/14 1600 07/15/14 0420 07/17/14 0500  CALCIUM 8.4  8.5 8.3* 8.5 7.9*  MG  --   --  2.2  --   PHOS 9.4* 5.7* 7.1*  --    Sepsis Markers  Recent Labs Lab 07/13/14 1843 07/14/14 1200 07/15/14 0420  LATICACIDVEN 0.67  --   --   PROCALCITON  --  1.75 2.46   ABG No results for input(s): PHART, PCO2ART, PO2ART in the last 168 hours. Liver Enzymes  Recent Labs Lab 07/13/14 1617  07/14/14 1600 07/15/14 0420 07/17/14 0500  AST 10  --   --   --  158*  ALT 21  --   --   --  67*  ALKPHOS 99  --   --   --  246*  BILITOT 0.5  --   --   --  1.0  ALBUMIN 3.4*  < > 2.9* 2.7* 2.5*  < > = values in this interval not displayed. Cardiac Enzymes  Recent Labs Lab 07/14/14 1610 07/14/14 2246 07/15/14 0420  TROPONINI 0.03 0.04* 0.05*   Glucose  Recent Labs Lab 07/16/14 1613 07/16/14 2004 07/17/14 0012 07/17/14 0410 07/17/14 0742 07/17/14 1103  GLUCAP 181* 237* 207* 170* 143* 161*    Imaging Ir Pta  Venous Right  07/15/2014   CLINICAL DATA:  54 year old male with end-stage renal disease on hemodialysis. His right forearm  EXAM: IR RIGHT DECLOT; PTA VENOUS; ARTERIOVENOUS SHUNT FOR DIALYSIS; ADDITIONAL ACCESS; IR ULTRASOUND GUIDANCE VASC ACCESS RIGHT; IR RIGHT FLOURO GUIDE CV LINE  Date: 07/15/2014  PROCEDURE: 1. Ultrasound-guided antegrade puncture adjacent to the arterial anastomosis 2. Ultrasound-guided retrograde puncture 3. Catheterization of the right subclavian vein with central and pull-back venogram 4. 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 Pharmacomechanical thrombolysis/thrombectomy declot procedure 6. Attempted catheterization of the arterial inflow with limited arteriogram 7. Ultrasound-guided puncture of the right subclavian vein from a supraclavicular approach 8. Placement of a 19 cm tip  to cuff tunneled hemodialysis catheter Interventional Radiologist:  Sterling Big, MD  ANESTHESIA/SEDATION: Moderate (conscious) sedation was used. 2 mg Versed, 100 mcg Fentanyl were administered intravenously. The patient's vital signs were monitored continuously by radiology nursing throughout the procedure.  Sedation Time: 80 minutes  MEDICATIONS: 2 g Ancef and 3000 units heparin administered intravenously. Ancef was administered within 1 hours of skin incision.  FLUOROSCOPY TIME:  15 minutes 30 seconds  CONTRAST:  30mL OMNIPAQUE IOHEXOL 300 MG/ML  SOLN  TECHNIQUE: Informed consent was obtained from the patient following explanation of the procedure, risks, benefits and alternatives. The patient understands, agrees and consents for the procedure. All questions were addressed. A time out was performed.  Maximal barrier sterile technique utilized including caps, mask, sterile gowns, sterile gloves, large sterile drape, hand hygiene, and Betadine skin prep.  The right upper extremity arteriovenous fistula was interrogated with ultrasound. There is subocclusive clot throughout the main stick zone. The vein proximal and distal to this is patent and compressible. However, there is a very weak pulse suggesting possible inflow disease versus high-grade central stenosis.  Local anesthesia was attained by infiltration with 1% lidocaine. Under real-time sonographic guidance, the vein was punctured in antegrade fashion several cm from the arterial anastomosis. A transitional 5 French micro sheath was advanced over a micro wire into the stick zone. 2 mg of tPA was then injected into the thrombus. This was manually macerated by gentle massage while the outflow vein was capped compressed with the ultrasound probe. 3000 units of heparin was injected intravenously.  The transitional micro sheath was then exchanged over a Bentson wire for a working 7 [place] vascular sheath. A Kumpe the catheter was advanced into the right  subclavian vein and a central venogram was performed. 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 A pull-back venogram was then performed demonstrating patency of the cephalic arch and proximal cephalic vein.  A Rosen wire was next advanced into the superior vena cava and the subclavian vein was angioplasty to 10 mm using a 10 x 40 mm Conquest balloon. The inflation was maintained at 20 atmospheres with full effacement for 2 minutes. Following angioplasty, the balloon was removed. The 6 French Angiojet device was then inserted over the [place] wire and rheolytic thrombectomy/thrombolysis was performed throughout the region of the fistula with internal thrombus. The Angiojet was then removed and a 7 x 40 mm Conquest balloon was advanced through the sheath an used to further balloon macerate and angioplasty the stick zone and proximal draining vein.  Following this, aspiration was performed through the sheath. There was blood flow however it was extremely week. Therefore, using similar technique to above, ultrasound-guided puncture of the draining vein in retrograde fashion directed toward the  arterial anastomosis was performed with a 21 gauge micropuncture needle. An image was obtained and stored for the medical record. Using the transitional micro sheath, a working 6 [place] vascular sheath was advanced into the vein. A Kumpe the catheter and Glidewire were used to advanced into the arterial anastomosis. A gentle hand injection of contrast material demonstrates stasis within the inflow artery secondary to the 5 French catheter being occlusive. This was confirmed is the catheter was gently removed. Once back within the main draining vein the contrast column slowly washed out into the fistula. There is no distal flow which represents an interval change compared to October of 2014. Unfortunately, the inflow radial artery is extremely atretic. There is no  outflow distal to the stenosis. The vessel measures approximately 2 mm in diameter.  Attempts were made to gently catheterize the very small radial artery, however the 5 French catheter was occlusive and a wire could not make the 90 degree bend. Given the very poor arterial inflow, the chance of obtaining sustained patency of this fistula is near 0. Therefore, further intervention was aborted.  Attention was turned to the right neck for placement of a tunneled hemodialysis catheter.  The right neck was interrogated with ultrasound. The right internal jugular vein is atretic and thick walled and cannot be visualized directly to the subclavian vein. The subclavian vein and external jugular vein are slightly hyper trophic. 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 As the subclavian vein was easily visible from a supraclavicular approach, it was decided to place the catheter directly into this vessel. Local anesthesia was attained by infiltration with 1% lidocaine. A small dermatotomy was made. Under real-time sonographic guidance (an image was obtained and stored for the medical record) the vessels punctured with a 21 gauge micropuncture needle. With the assistance of a 5 French micro sheath, a 0.018 inch wire was advanced into the heart in the intravascular depth measured. The micro wire was then removed and a 0.035 inch wire was advanced through the heart and into the inferior vena cava.  A suitable skin exit site inferior to the clavicle was selected. Local anesthesia was attained by 1% lidocaine infiltration. A small dermatotomy was made. A 19 cm tip to cuff Bard hemo split hemodialysis catheter was then tunneled from the skin exit site to the dermatotomy overlying the venous access site. A peel-away sheath was then advanced over the Bentson wire and into the right heart. The tunneled hemodialysis catheter was then fed through the peel-away sheath and the tips positioned in the upper right atrium under  fluoroscopic imaging. An image was obtained and stored.  The catheter flushed and aspirated with ease and was locked with heparinized saline. The catheter was then secured to the skin with 0 Prolene suture and a sterile bandage. The dermatotomy over the venous access site was sealed with Dermabond. The patient tolerated the procedure well.  COMPLICATIONS: None  IMPRESSION: 1. 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 2. Unsuccessful declot procedure secondary to very poor arterial inflow from a small, atherosclerotic and atretic radial artery. 3. Successful placement of a right IJ approach 19 cm tip to cuff tunneled hemodialysis catheter. Catheter tips are in the upper right atrium and ready for immediate use.  PLAN: 1. Recommend referral to vascular surgery for revision of the right upper extremity arteriovenous fistula. Signed,  Sterling Big, MD  [facility]  [facility]   Electronically Signed   By: Vilma Prader  Archer Asa M.D.   On: 07/15/2014 17:16   Ir Pta Venous Right  07/15/2014   CLINICAL DATA:  54 year old male with end-stage renal disease on hemodialysis. His right forearm  EXAM: IR RIGHT DECLOT; PTA VENOUS; ARTERIOVENOUS SHUNT FOR DIALYSIS; ADDITIONAL ACCESS; IR ULTRASOUND GUIDANCE VASC ACCESS RIGHT; IR RIGHT FLOURO GUIDE CV LINE  Date: 07/15/2014  PROCEDURE: 1. Ultrasound-guided antegrade puncture adjacent to the arterial anastomosis 2. Ultrasound-guided retrograde puncture 3. Catheterization of the right subclavian vein with central and pull-back venogram 4. 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 Pharmacomechanical thrombolysis/thrombectomy declot procedure 6. Attempted catheterization of the arterial inflow with limited arteriogram 7. Ultrasound-guided puncture of the right subclavian vein from a supraclavicular approach 8. Placement of a 19 cm tip to cuff tunneled hemodialysis catheter Interventional Radiologist:  Sterling Big, MD  ANESTHESIA/SEDATION: Moderate (conscious) sedation was used. 2 mg Versed, 100 mcg Fentanyl were administered intravenously. The patient's vital signs were monitored continuously by radiology nursing throughout the procedure.  Sedation Time: 80 minutes  MEDICATIONS: 2 g Ancef and 3000 units heparin administered intravenously. Ancef was administered within 1 hours of skin incision.  FLUOROSCOPY TIME:  15 minutes 30 seconds  CONTRAST:  30mL OMNIPAQUE IOHEXOL 300 MG/ML  SOLN  TECHNIQUE: Informed consent was obtained from the patient following explanation of the procedure, risks, benefits and alternatives. The patient understands, agrees and consents for the procedure. All questions were addressed. A time out was performed.  Maximal barrier sterile technique utilized including caps, mask, sterile gowns, sterile gloves, large sterile drape, hand hygiene, and Betadine skin prep.  The right upper extremity arteriovenous fistula was interrogated with ultrasound. There is subocclusive clot throughout the main stick zone. The vein proximal and distal to this is patent and compressible. However, there is a very weak pulse suggesting possible inflow disease versus high-grade central stenosis.  Local anesthesia was attained by infiltration with 1% lidocaine. Under real-time sonographic guidance, the vein was punctured in antegrade fashion several cm from the arterial anastomosis. A transitional 5 French micro sheath was advanced over a micro wire into the stick zone. 2 mg of tPA was then injected into the thrombus. This was manually macerated by gentle massage while the outflow vein was capped compressed with the ultrasound probe. 3000 units of heparin was injected intravenously.  The transitional micro sheath was then exchanged over a Bentson wire for a working 7 [place] vascular sheath. A Kumpe the catheter was advanced into the right subclavian vein and a central venogram was performed. There appears to be a  moderate to high-grade stenosis of the subclavian vein as it passes under the clavicle. A pull-back venogram was then performed demonstrating patency of the cephalic arch and proximal cephalic vein.  A Rosen wire was next advanced into the superior vena cava and the subclavian vein was angioplasty to 10 mm using a 10 x 40 mm Conquest balloon. The inflation was maintained at 20 atmospheres with full effacement for 2 minutes. Following angioplasty, the balloon was removed. The 6 French Angiojet device was then inserted over the [place] wire and rheolytic thrombectomy/thrombolysis was performed throughout the region of the fistula with internal thrombus. The Angiojet was then removed and a 7 x 40 mm Conquest balloon was advanced through the sheath an used to further balloon macerate and angioplasty the stick zone and proximal draining vein.  Following this, aspiration was performed through the sheath. There was blood flow however it was extremely week. Therefore, using similar technique to above, ultrasound-guided  puncture of the draining vein in retrograde fashion directed toward the arterial anastomosis was performed with a 21 gauge micropuncture needle. An image was obtained and stored for the medical record. Using the transitional micro sheath, a working 6 [place] vascular sheath was advanced into the vein. A Kumpe the catheter and Glidewire were used to advanced into the arterial anastomosis. A gentle hand injection of contrast material demonstrates stasis within the inflow artery secondary to the 5 French catheter being occlusive. This was confirmed is the catheter was gently removed. Once back within the main draining vein the contrast column slowly washed out into the fistula. There is no distal flow which represents an interval change compared to October of 2014. Unfortunately, the inflow radial artery is extremely atretic. There is no outflow distal to the stenosis. The vessel measures approximately 2 mm in  diameter.  Attempts were made to gently catheterize the very small radial artery, however the 5 French catheter was occlusive and a wire could not make the 90 degree bend. Given the very poor arterial inflow, the chance of obtaining sustained patency of this fistula is near 0. Therefore, further intervention was aborted.  Attention was turned to the right neck for placement of a tunneled hemodialysis catheter.  The right neck was interrogated with ultrasound. The right internal jugular vein is atretic and thick walled and cannot be visualized directly to the subclavian vein. The subclavian vein and external jugular vein are slightly hyper trophic. 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 As the subclavian vein was easily visible from a supraclavicular approach, it was decided to place the catheter directly into this vessel. Local anesthesia was attained by infiltration with 1% lidocaine. A small dermatotomy was made. Under real-time sonographic guidance (an image was obtained and stored for the medical record) the vessels punctured with a 21 gauge micropuncture needle. With the assistance of a 5 French micro sheath, a 0.018 inch wire was advanced into the heart in the intravascular depth measured. The micro wire was then removed and a 0.035 inch wire was advanced through the heart and into the inferior vena cava.  A suitable skin exit site inferior to the clavicle was selected. Local anesthesia was attained by 1% lidocaine infiltration. A small dermatotomy was made. A 19 cm tip to cuff Bard hemo split hemodialysis catheter was then tunneled from the skin exit site to the dermatotomy overlying the venous access site. A peel-away sheath was then advanced over the Bentson wire and into the right heart. The tunneled hemodialysis catheter was then fed through the peel-away sheath and the tips positioned in the upper right atrium under fluoroscopic imaging. An image was obtained and stored.  The catheter flushed  and aspirated with ease and was locked with heparinized saline. The catheter was then secured to the skin with 0 Prolene suture and a sterile bandage. The dermatotomy over the venous access site was sealed with Dermabond. The patient tolerated the procedure well.  COMPLICATIONS: None  IMPRESSION: 1. 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 2. Unsuccessful declot procedure secondary to very poor arterial inflow from a small, atherosclerotic and atretic radial artery. 3. Successful placement of a right IJ approach 19 cm tip to cuff tunneled hemodialysis catheter. Catheter tips are in the upper right atrium and ready for immediate use.  PLAN: 1. Recommend referral to vascular surgery for revision of the right upper extremity arteriovenous fistula. Signed,  Sterling Big, MD  [facility]  [facility]   Electronically Signed   By: Malachy MoanHeath  McCullough M.D.   On: 07/15/2014 17:16   Ir Fluoro Guide Cv Line Right  07/15/2014   CLINICAL DATA:  54 year old male with end-stage renal disease on hemodialysis. His right forearm  EXAM: IR RIGHT DECLOT; PTA VENOUS; ARTERIOVENOUS SHUNT FOR DIALYSIS; ADDITIONAL ACCESS; IR ULTRASOUND GUIDANCE VASC ACCESS RIGHT; IR RIGHT FLOURO GUIDE CV LINE  Date: 07/15/2014  PROCEDURE: 1. Ultrasound-guided antegrade puncture adjacent to the arterial anastomosis 2. Ultrasound-guided retrograde puncture 3. Catheterization of the right subclavian vein with central and pull-back venogram 4. 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 Pharmacomechanical thrombolysis/thrombectomy declot procedure 6. Attempted catheterization of the arterial inflow with limited arteriogram 7. Ultrasound-guided puncture of the right subclavian vein from a supraclavicular approach 8. Placement of a 19 cm tip to cuff tunneled hemodialysis catheter Interventional Radiologist:  Sterling BigHeath K. McCullough, MD  ANESTHESIA/SEDATION: Moderate (conscious)  sedation was used. 2 mg Versed, 100 mcg Fentanyl were administered intravenously. The patient's vital signs were monitored continuously by radiology nursing throughout the procedure.  Sedation Time: 80 minutes  MEDICATIONS: 2 g Ancef and 3000 units heparin administered intravenously. Ancef was administered within 1 hours of skin incision.  FLUOROSCOPY TIME:  15 minutes 30 seconds  CONTRAST:  30mL OMNIPAQUE IOHEXOL 300 MG/ML  SOLN  TECHNIQUE: Informed consent was obtained from the patient following explanation of the procedure, risks, benefits and alternatives. The patient understands, agrees and consents for the procedure. All questions were addressed. A time out was performed.  Maximal barrier sterile technique utilized including caps, mask, sterile gowns, sterile gloves, large sterile drape, hand hygiene, and Betadine skin prep.  The right upper extremity arteriovenous fistula was interrogated with ultrasound. There is subocclusive clot throughout the main stick zone. The vein proximal and distal to this is patent and compressible. However, there is a very weak pulse suggesting possible inflow disease versus high-grade central stenosis.  Local anesthesia was attained by infiltration with 1% lidocaine. Under real-time sonographic guidance, the vein was punctured in antegrade fashion several cm from the arterial anastomosis. A transitional 5 French micro sheath was advanced over a micro wire into the stick zone. 2 mg of tPA was then injected into the thrombus. This was manually macerated by gentle massage while the outflow vein was capped compressed with the ultrasound probe. 3000 units of heparin was injected intravenously.  The transitional micro sheath was then exchanged over a Bentson wire for a working 7 [place] vascular sheath. A Kumpe the catheter was advanced into the right subclavian vein and a central venogram was performed. 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 A pull-back venogram was then performed demonstrating patency of the cephalic arch and proximal cephalic vein.  A Rosen wire was next advanced into the superior vena cava and the subclavian vein was angioplasty to 10 mm using a 10 x 40 mm Conquest balloon. The inflation was maintained at 20 atmospheres with full effacement for 2 minutes. Following angioplasty, the balloon was removed. The 6 French Angiojet device was then inserted over the [place] wire and rheolytic thrombectomy/thrombolysis was performed throughout the region of the fistula with internal thrombus. The Angiojet was then removed and a 7 x 40 mm Conquest balloon was advanced through the sheath an used to further balloon macerate and angioplasty the stick zone and proximal draining vein.  Following this, aspiration was performed through the sheath. There was blood  flow however it was extremely week. Therefore, using similar technique to above, ultrasound-guided puncture of the draining vein in retrograde fashion directed toward the arterial anastomosis was performed with a 21 gauge micropuncture needle. An image was obtained and stored for the medical record. Using the transitional micro sheath, a working 6 [place] vascular sheath was advanced into the vein. A Kumpe the catheter and Glidewire were used to advanced into the arterial anastomosis. A gentle hand injection of contrast material demonstrates stasis within the inflow artery secondary to the 5 French catheter being occlusive. This was confirmed is the catheter was gently removed. Once back within the main draining vein the contrast column slowly washed out into the fistula. There is no distal flow which represents an interval change compared to October of 2014. Unfortunately, the inflow radial artery is extremely atretic. There is no outflow distal to the stenosis. The vessel measures approximately 2 mm in diameter.  Attempts were made to gently catheterize the very  small radial artery, however the 5 French catheter was occlusive and a wire could not make the 90 degree bend. Given the very poor arterial inflow, the chance of obtaining sustained patency of this fistula is near 0. Therefore, further intervention was aborted.  Attention was turned to the right neck for placement of a tunneled hemodialysis catheter.  The right neck was interrogated with ultrasound. The right internal jugular vein is atretic and thick walled and cannot be visualized directly to the subclavian vein. The subclavian vein and external jugular vein are slightly hyper trophic. 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 As the subclavian vein was easily visible from a supraclavicular approach, it was decided to place the catheter directly into this vessel. Local anesthesia was attained by infiltration with 1% lidocaine. A small dermatotomy was made. Under real-time sonographic guidance (an image was obtained and stored for the medical record) the vessels punctured with a 21 gauge micropuncture needle. With the assistance of a 5 French micro sheath, a 0.018 inch wire was advanced into the heart in the intravascular depth measured. The micro wire was then removed and a 0.035 inch wire was advanced through the heart and into the inferior vena cava.  A suitable skin exit site inferior to the clavicle was selected. Local anesthesia was attained by 1% lidocaine infiltration. A small dermatotomy was made. A 19 cm tip to cuff Bard hemo split hemodialysis catheter was then tunneled from the skin exit site to the dermatotomy overlying the venous access site. A peel-away sheath was then advanced over the Bentson wire and into the right heart. The tunneled hemodialysis catheter was then fed through the peel-away sheath and the tips positioned in the upper right atrium under fluoroscopic imaging. An image was obtained and stored.  The catheter flushed and aspirated with ease and was locked with heparinized saline.  The catheter was then secured to the skin with 0 Prolene suture and a sterile bandage. The dermatotomy over the venous access site was sealed with Dermabond. The patient tolerated the procedure well.  COMPLICATIONS: None  IMPRESSION: 1. 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 2. Unsuccessful declot procedure secondary to very poor arterial inflow from a small, atherosclerotic and atretic radial artery. 3. Successful placement of a right IJ approach 19 cm tip to cuff tunneled hemodialysis catheter. Catheter tips are in the upper right atrium and ready for immediate use.  PLAN: 1. Recommend referral to vascular surgery for revision of the right upper extremity arteriovenous fistula.  Signed,  Sterling Big, MD  [facility]  [facility]   Electronically Signed   By: Malachy Moan M.D.   On: 07/15/2014 17:16   Ir Angio Av Shunt Addl Access  07/15/2014   CLINICAL DATA:  54 year old male with end-stage renal disease on hemodialysis. His right forearm  EXAM: IR RIGHT DECLOT; PTA VENOUS; ARTERIOVENOUS SHUNT FOR DIALYSIS; ADDITIONAL ACCESS; IR ULTRASOUND GUIDANCE VASC ACCESS RIGHT; IR RIGHT FLOURO GUIDE CV LINE  Date: 07/15/2014  PROCEDURE: 1. Ultrasound-guided antegrade puncture adjacent to the arterial anastomosis 2. Ultrasound-guided retrograde puncture 3. Catheterization of the right subclavian vein with central and pull-back venogram 4. 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 Pharmacomechanical thrombolysis/thrombectomy declot procedure 6. Attempted catheterization of the arterial inflow with limited arteriogram 7. Ultrasound-guided puncture of the right subclavian vein from a supraclavicular approach 8. Placement of a 19 cm tip to cuff tunneled hemodialysis catheter Interventional Radiologist:  Sterling Big, MD  ANESTHESIA/SEDATION: Moderate (conscious) sedation was used. 2 mg Versed, 100 mcg Fentanyl were administered  intravenously. The patient's vital signs were monitored continuously by radiology nursing throughout the procedure.  Sedation Time: 80 minutes  MEDICATIONS: 2 g Ancef and 3000 units heparin administered intravenously. Ancef was administered within 1 hours of skin incision.  FLUOROSCOPY TIME:  15 minutes 30 seconds  CONTRAST:  30mL OMNIPAQUE IOHEXOL 300 MG/ML  SOLN  TECHNIQUE: Informed consent was obtained from the patient following explanation of the procedure, risks, benefits and alternatives. The patient understands, agrees and consents for the procedure. All questions were addressed. A time out was performed.  Maximal barrier sterile technique utilized including caps, mask, sterile gowns, sterile gloves, large sterile drape, hand hygiene, and Betadine skin prep.  The right upper extremity arteriovenous fistula was interrogated with ultrasound. There is subocclusive clot throughout the main stick zone. The vein proximal and distal to this is patent and compressible. However, there is a very weak pulse suggesting possible inflow disease versus high-grade central stenosis.  Local anesthesia was attained by infiltration with 1% lidocaine. Under real-time sonographic guidance, the vein was punctured in antegrade fashion several cm from the arterial anastomosis. A transitional 5 French micro sheath was advanced over a micro wire into the stick zone. 2 mg of tPA was then injected into the thrombus. This was manually macerated by gentle massage while the outflow vein was capped compressed with the ultrasound probe. 3000 units of heparin was injected intravenously.  The transitional micro sheath was then exchanged over a Bentson wire for a working 7 [place] vascular sheath. A Kumpe the catheter was advanced into the right subclavian vein and a central venogram was performed. 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 A pull-back venogram was then performed  demonstrating patency of the cephalic arch and proximal cephalic vein.  A Rosen wire was next advanced into the superior vena cava and the subclavian vein was angioplasty to 10 mm using a 10 x 40 mm Conquest balloon. The inflation was maintained at 20 atmospheres with full effacement for 2 minutes. Following angioplasty, the balloon was removed. The 6 French Angiojet device was then inserted over the [place] wire and rheolytic thrombectomy/thrombolysis was performed throughout the region of the fistula with internal thrombus. The Angiojet was then removed and a 7 x 40 mm Conquest balloon was advanced through the sheath an used to further balloon macerate and angioplasty the stick zone and proximal draining  vein.  Following this, aspiration was performed through the sheath. There was blood flow however it was extremely week. Therefore, using similar technique to above, ultrasound-guided puncture of the draining vein in retrograde fashion directed toward the arterial anastomosis was performed with a 21 gauge micropuncture needle. An image was obtained and stored for the medical record. Using the transitional micro sheath, a working 6 [place] vascular sheath was advanced into the vein. A Kumpe the catheter and Glidewire were used to advanced into the arterial anastomosis. A gentle hand injection of contrast material demonstrates stasis within the inflow artery secondary to the 5 French catheter being occlusive. This was confirmed is the catheter was gently removed. Once back within the main draining vein the contrast column slowly washed out into the fistula. There is no distal flow which represents an interval change compared to October of 2014. Unfortunately, the inflow radial artery is extremely atretic. There is no outflow distal to the stenosis. The vessel measures approximately 2 mm in diameter.  Attempts were made to gently catheterize the very small radial artery, however the 5 French catheter was occlusive and a  wire could not make the 90 degree bend. Given the very poor arterial inflow, the chance of obtaining sustained patency of this fistula is near 0. Therefore, further intervention was aborted.  Attention was turned to the right neck for placement of a tunneled hemodialysis catheter.  The right neck was interrogated with ultrasound. The right internal jugular vein is atretic and thick walled and cannot be visualized directly to the subclavian vein. The subclavian vein and external jugular vein are slightly hyper trophic. 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 As the subclavian vein was easily visible from a supraclavicular approach, it was decided to place the catheter directly into this vessel. Local anesthesia was attained by infiltration with 1% lidocaine. A small dermatotomy was made. Under real-time sonographic guidance (an image was obtained and stored for the medical record) the vessels punctured with a 21 gauge micropuncture needle. With the assistance of a 5 French micro sheath, a 0.018 inch wire was advanced into the heart in the intravascular depth measured. The micro wire was then removed and a 0.035 inch wire was advanced through the heart and into the inferior vena cava.  A suitable skin exit site inferior to the clavicle was selected. Local anesthesia was attained by 1% lidocaine infiltration. A small dermatotomy was made. A 19 cm tip to cuff Bard hemo split hemodialysis catheter was then tunneled from the skin exit site to the dermatotomy overlying the venous access site. A peel-away sheath was then advanced over the Bentson wire and into the right heart. The tunneled hemodialysis catheter was then fed through the peel-away sheath and the tips positioned in the upper right atrium under fluoroscopic imaging. An image was obtained and stored.  The catheter flushed and aspirated with ease and was locked with heparinized saline. The catheter was then secured to the skin with 0 Prolene suture and a  sterile bandage. The dermatotomy over the venous access site was sealed with Dermabond. The patient tolerated the procedure well.  COMPLICATIONS: None  IMPRESSION: 1. 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 2. Unsuccessful declot procedure secondary to very poor arterial inflow from a small, atherosclerotic and atretic radial artery. 3. Successful placement of a right IJ approach 19 cm tip to cuff tunneled hemodialysis catheter. Catheter tips are in the upper right atrium and ready for immediate use.  PLAN: 1. Recommend  referral to vascular surgery for revision of the right upper extremity arteriovenous fistula. Signed,  Sterling Big, MD  [facility]  [facility] Radiology   Electronically Signed   By: Malachy Moan M.D.   On: 07/15/2014 17:16   Ir US Guide Vasc Access Right  07/15/2014   CLINICAL DATA:  54 year old male with end-stage renal disease on hemodialysis. His right forearm  EXAM: IR RIGHT DECLOT; PTA VENOUS; ARTERIOVENOUS SHUNT FOR DIALYSIS; ADDITIONAL ACCESS; IR ULTRASOUND GUIDANCE VASC ACCESS RIGHT; IR RIGHT FLOURO GUIDE CV LINE  Date: 07/15/2014  PROCEDURE: 1. Ultrasound-guided antegrade puncture adjacent to the arterial anastomosis 2. Ultrasound-guided retrograde puncture 3. Catheterization of the right subclavian vein with central and pull-back venogram 4. 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 Pharmacomechanical thrombolysis/thrombectomy declot procedure 6. Attempted catheterization of the arterial inflow with limited arteriogram 7. Ultrasound-guided puncture of the right subclavian vein from a supraclavicular approach 8. Placement of a 19 cm tip to cuff tunneled hemodialysis catheter Interventional Radiologist:  Sterling Big, MD  ANESTHESIA/SEDATION: Moderate (conscious) sedation was used. 2 mg Versed, 100 mcg Fentanyl were administered intravenously. The patient's vital signs were monitored continuously by  radiology nursing throughout the procedure.  Sedation Time: 80 minutes  MEDICATIONS: 2 g Ancef and 3000 units heparin administered intravenously. Ancef was administered within 1 hours of skin incision.  FLUOROSCOPY TIME:  15 minutes 30 seconds  CONTRAST:  30mL OMNIPAQUE IOHEXOL 300 MG/ML  SOLN  TECHNIQUE: Informed consent was obtained from the patient following explanation of the procedure, risks, benefits and alternatives. The patient understands, agrees and consents for the procedure. All questions were addressed. A time out was performed.  Maximal barrier sterile technique utilized including caps, mask, sterile gowns, sterile gloves, large sterile drape, hand hygiene, and Betadine skin prep.  The right upper extremity arteriovenous fistula was interrogated with ultrasound. There is subocclusive clot throughout the main stick zone. The vein proximal and distal to this is patent and compressible. However, there is a very weak pulse suggesting possible inflow disease versus high-grade central stenosis.  Local anesthesia was attained by infiltration with 1% lidocaine. Under real-time sonographic guidance, the vein was punctured in antegrade fashion several cm from the arterial anastomosis. A transitional 5 French micro sheath was advanced over a micro wire into the stick zone. 2 mg of tPA was then injected into the thrombus. This was manually macerated by gentle massage while the outflow vein was capped compressed with the ultrasound probe. 3000 units of heparin was injected intravenously.  The transitional micro sheath was then exchanged over a Bentson wire for a working 7 [place] vascular sheath. A Kumpe the catheter was advanced into the right subclavian vein and a central venogram was performed. 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 A pull-back venogram was then performed demonstrating patency of the cephalic arch and proximal cephalic vein.   A Rosen wire was next advanced into the superior vena cava and the subclavian vein was angioplasty to 10 mm using a 10 x 40 mm Conquest balloon. The inflation was maintained at 20 atmospheres with full effacement for 2 minutes. Following angioplasty, the balloon was removed. The 6 French Angiojet device was then inserted over the [place] wire and rheolytic thrombectomy/thrombolysis was performed throughout the region of the fistula with internal thrombus. The Angiojet was then removed and a 7 x 40 mm Conquest balloon was advanced through the sheath an  used to further balloon macerate and angioplasty the stick zone and proximal draining vein.  Following this, aspiration was performed through the sheath. There was blood flow however it was extremely week. Therefore, using similar technique to above, ultrasound-guided puncture of the draining vein in retrograde fashion directed toward the arterial anastomosis was performed with a 21 gauge micropuncture needle. An image was obtained and stored for the medical record. Using the transitional micro sheath, a working 6 [place] vascular sheath was advanced into the vein. A Kumpe the catheter and Glidewire were used to advanced into the arterial anastomosis. A gentle hand injection of contrast material demonstrates stasis within the inflow artery secondary to the 5 French catheter being occlusive. This was confirmed is the catheter was gently removed. Once back within the main draining vein the contrast column slowly washed out into the fistula. There is no distal flow which represents an interval change compared to October of 2014. Unfortunately, the inflow radial artery is extremely atretic. There is no outflow distal to the stenosis. The vessel measures approximately 2 mm in diameter.  Attempts were made to gently catheterize the very small radial artery, however the 5 French catheter was occlusive and a wire could not make the 90 degree bend. Given the very poor arterial  inflow, the chance of obtaining sustained patency of this fistula is near 0. Therefore, further intervention was aborted.  Attention was turned to the right neck for placement of a tunneled hemodialysis catheter.  The right neck was interrogated with ultrasound. The right internal jugular vein is atretic and thick walled and cannot be visualized directly to the subclavian vein. The subclavian vein and external jugular vein are slightly hyper trophic. 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 As the subclavian vein was easily visible from a supraclavicular approach, it was decided to place the catheter directly into this vessel. Local anesthesia was attained by infiltration with 1% lidocaine. A small dermatotomy was made. Under real-time sonographic guidance (an image was obtained and stored for the medical record) the vessels punctured with a 21 gauge micropuncture needle. With the assistance of a 5 French micro sheath, a 0.018 inch wire was advanced into the heart in the intravascular depth measured. The micro wire was then removed and a 0.035 inch wire was advanced through the heart and into the inferior vena cava.  A suitable skin exit site inferior to the clavicle was selected. Local anesthesia was attained by 1% lidocaine infiltration. A small dermatotomy was made. A 19 cm tip to cuff Bard hemo split hemodialysis catheter was then tunneled from the skin exit site to the dermatotomy overlying the venous access site. A peel-away sheath was then advanced over the Bentson wire and into the right heart. The tunneled hemodialysis catheter was then fed through the peel-away sheath and the tips positioned in the upper right atrium under fluoroscopic imaging. An image was obtained and stored.  The catheter flushed and aspirated with ease and was locked with heparinized saline. The catheter was then secured to the skin with 0 Prolene suture and a sterile bandage. The dermatotomy over the venous access site was  sealed with Dermabond. The patient tolerated the procedure well.  COMPLICATIONS: None  IMPRESSION: 1. 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 2. Unsuccessful declot procedure secondary to very poor arterial inflow from a small, atherosclerotic and atretic radial artery. 3. Successful placement of a right IJ approach 19 cm tip to cuff tunneled hemodialysis catheter. Catheter tips are in  the upper right atrium and ready for immediate use.  PLAN: 1. Recommend referral to vascular surgery for revision of the right upper extremity arteriovenous fistula. Signed,  Sterling Big, MD  [facility]  [facility] Radiology   Electronically Signed   By: Malachy Moan M.D.   On: 07/15/2014 17:16   Ir US Guide Vasc Access Right  07/15/2014   CLINICAL DATA:  54 year old male with end-stage renal disease on hemodialysis. His right forearm  EXAM: IR RIGHT DECLOT; PTA VENOUS; ARTERIOVENOUS SHUNT FOR DIALYSIS; ADDITIONAL ACCESS; IR ULTRASOUND GUIDANCE VASC ACCESS RIGHT; IR RIGHT FLOURO GUIDE CV LINE  Date: 07/15/2014  PROCEDURE: 1. Ultrasound-guided antegrade puncture adjacent to the arterial anastomosis 2. Ultrasound-guided retrograde puncture 3. Catheterization of the right subclavian vein with central and pull-back venogram 4. 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 Pharmacomechanical thrombolysis/thrombectomy declot procedure 6. Attempted catheterization of the arterial inflow with limited arteriogram 7. Ultrasound-guided puncture of the right subclavian vein from a supraclavicular approach 8. Placement of a 19 cm tip to cuff tunneled hemodialysis catheter Interventional Radiologist:  Sterling Big, MD  ANESTHESIA/SEDATION: Moderate (conscious) sedation was used. 2 mg Versed, 100 mcg Fentanyl were administered intravenously. The patient's vital signs were monitored continuously by radiology nursing throughout the procedure.  Sedation Time: 80  minutes  MEDICATIONS: 2 g Ancef and 3000 units heparin administered intravenously. Ancef was administered within 1 hours of skin incision.  FLUOROSCOPY TIME:  15 minutes 30 seconds  CONTRAST:  30mL OMNIPAQUE IOHEXOL 300 MG/ML  SOLN  TECHNIQUE: Informed consent was obtained from the patient following explanation of the procedure, risks, benefits and alternatives. The patient understands, agrees and consents for the procedure. All questions were addressed. A time out was performed.  Maximal barrier sterile technique utilized including caps, mask, sterile gowns, sterile gloves, large sterile drape, hand hygiene, and Betadine skin prep.  The right upper extremity arteriovenous fistula was interrogated with ultrasound. There is subocclusive clot throughout the main stick zone. The vein proximal and distal to this is patent and compressible. However, there is a very weak pulse suggesting possible inflow disease versus high-grade central stenosis.  Local anesthesia was attained by infiltration with 1% lidocaine. Under real-time sonographic guidance, the vein was punctured in antegrade fashion several cm from the arterial anastomosis. A transitional 5 French micro sheath was advanced over a micro wire into the stick zone. 2 mg of tPA was then injected into the thrombus. This was manually macerated by gentle massage while the outflow vein was capped compressed with the ultrasound probe. 3000 units of heparin was injected intravenously.  The transitional micro sheath was then exchanged over a Bentson wire for a working 7 [place] vascular sheath. A Kumpe the catheter was advanced into the right subclavian vein and a central venogram was performed. 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 A pull-back venogram was then performed demonstrating patency of the cephalic arch and proximal cephalic vein.  A Rosen wire was next advanced into the superior vena cava and  the subclavian vein was angioplasty to 10 mm using a 10 x 40 mm Conquest balloon. The inflation was maintained at 20 atmospheres with full effacement for 2 minutes. Following angioplasty, the balloon was removed. The 6 French Angiojet device was then inserted over the [place] wire and rheolytic thrombectomy/thrombolysis was performed throughout the region of the fistula with internal thrombus. The Angiojet was then removed and  a 7 x 40 mm Conquest balloon was advanced through the sheath an used to further balloon macerate and angioplasty the stick zone and proximal draining vein.  Following this, aspiration was performed through the sheath. There was blood flow however it was extremely week. Therefore, using similar technique to above, ultrasound-guided puncture of the draining vein in retrograde fashion directed toward the arterial anastomosis was performed with a 21 gauge micropuncture needle. An image was obtained and stored for the medical record. Using the transitional micro sheath, a working 6 [place] vascular sheath was advanced into the vein. A Kumpe the catheter and Glidewire were used to advanced into the arterial anastomosis. A gentle hand injection of contrast material demonstrates stasis within the inflow artery secondary to the 5 French catheter being occlusive. This was confirmed is the catheter was gently removed. Once back within the main draining vein the contrast column slowly washed out into the fistula. There is no distal flow which represents an interval change compared to October of 2014. Unfortunately, the inflow radial artery is extremely atretic. There is no outflow distal to the stenosis. The vessel measures approximately 2 mm in diameter.  Attempts were made to gently catheterize the very small radial artery, however the 5 French catheter was occlusive and a wire could not make the 90 degree bend. Given the very poor arterial inflow, the chance of obtaining sustained patency of this fistula  is near 0. Therefore, further intervention was aborted.  Attention was turned to the right neck for placement of a tunneled hemodialysis catheter.  The right neck was interrogated with ultrasound. The right internal jugular vein is atretic and thick walled and cannot be visualized directly to the subclavian vein. The subclavian vein and external jugular vein are slightly hyper trophic. 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 As the subclavian vein was easily visible from a supraclavicular approach, it was decided to place the catheter directly into this vessel. Local anesthesia was attained by infiltration with 1% lidocaine. A small dermatotomy was made. Under real-time sonographic guidance (an image was obtained and stored for the medical record) the vessels punctured with a 21 gauge micropuncture needle. With the assistance of a 5 French micro sheath, a 0.018 inch wire was advanced into the heart in the intravascular depth measured. The micro wire was then removed and a 0.035 inch wire was advanced through the heart and into the inferior vena cava.  A suitable skin exit site inferior to the clavicle was selected. Local anesthesia was attained by 1% lidocaine infiltration. A small dermatotomy was made. A 19 cm tip to cuff Bard hemo split hemodialysis catheter was then tunneled from the skin exit site to the dermatotomy overlying the venous access site. A peel-away sheath was then advanced over the Bentson wire and into the right heart. The tunneled hemodialysis catheter was then fed through the peel-away sheath and the tips positioned in the upper right atrium under fluoroscopic imaging. An image was obtained and stored.  The catheter flushed and aspirated with ease and was locked with heparinized saline. The catheter was then secured to the skin with 0 Prolene suture and a sterile bandage. The dermatotomy over the venous access site was sealed with Dermabond. The patient tolerated the procedure well.   COMPLICATIONS: None  IMPRESSION: 1. 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 2. Unsuccessful declot procedure secondary to very poor arterial inflow from a small, atherosclerotic and atretic radial artery. 3. Successful placement of a right IJ  approach 19 cm tip to cuff tunneled hemodialysis catheter. Catheter tips are in the upper right atrium and ready for immediate use.  PLAN: 1. Recommend referral to vascular surgery for revision of the right upper extremity arteriovenous fistula. Signed,  Sterling Big, MD  [facility]  [facility]   Electronically Signed   By: Malachy Moan M.D.   On: 07/15/2014 17:16   Ir Declot Right Mod Sed  07/15/2014   CLINICAL DATA:  54 year old male with end-stage renal disease on hemodialysis. His right forearm  EXAM: IR RIGHT DECLOT; PTA VENOUS; ARTERIOVENOUS SHUNT FOR DIALYSIS; ADDITIONAL ACCESS; IR ULTRASOUND GUIDANCE VASC ACCESS RIGHT; IR RIGHT FLOURO GUIDE CV LINE  Date: 07/15/2014  PROCEDURE: 1. Ultrasound-guided antegrade puncture adjacent to the arterial anastomosis 2. Ultrasound-guided retrograde puncture 3. Catheterization of the right subclavian vein with central and pull-back venogram 4. 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 Pharmacomechanical thrombolysis/thrombectomy declot procedure 6. Attempted catheterization of the arterial inflow with limited arteriogram 7. Ultrasound-guided puncture of the right subclavian vein from a supraclavicular approach 8. Placement of a 19 cm tip to cuff tunneled hemodialysis catheter Interventional Radiologist:  Sterling Big, MD  ANESTHESIA/SEDATION: Moderate (conscious) sedation was used. 2 mg Versed, 100 mcg Fentanyl were administered intravenously. The patient's vital signs were monitored continuously by radiology nursing throughout the procedure.  Sedation Time: 80 minutes  MEDICATIONS: 2 g Ancef and 3000 units heparin administered  intravenously. Ancef was administered within 1 hours of skin incision.  FLUOROSCOPY TIME:  15 minutes 30 seconds  CONTRAST:  30mL OMNIPAQUE IOHEXOL 300 MG/ML  SOLN  TECHNIQUE: Informed consent was obtained from the patient following explanation of the procedure, risks, benefits and alternatives. The patient understands, agrees and consents for the procedure. All questions were addressed. A time out was performed.  Maximal barrier sterile technique utilized including caps, mask, sterile gowns, sterile gloves, large sterile drape, hand hygiene, and Betadine skin prep.  The right upper extremity arteriovenous fistula was interrogated with ultrasound. There is subocclusive clot throughout the main stick zone. The vein proximal and distal to this is patent and compressible. However, there is a very weak pulse suggesting possible inflow disease versus high-grade central stenosis.  Local anesthesia was attained by infiltration with 1% lidocaine. Under real-time sonographic guidance, the vein was punctured in antegrade fashion several cm from the arterial anastomosis. A transitional 5 French micro sheath was advanced over a micro wire into the stick zone. 2 mg of tPA was then injected into the thrombus. This was manually macerated by gentle massage while the outflow vein was capped compressed with the ultrasound probe. 3000 units of heparin was injected intravenously.  The transitional micro sheath was then exchanged over a Bentson wire for a working 7 [place] vascular sheath. A Kumpe the catheter was advanced into the right subclavian vein and a central venogram was performed. 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 A pull-back venogram was then performed demonstrating patency of the cephalic arch and proximal cephalic vein.  A Rosen wire was next advanced into the superior vena cava and the subclavian vein was angioplasty to 10 mm using a 10 x 40 mm  Conquest balloon. The inflation was maintained at 20 atmospheres with full effacement for 2 minutes. Following angioplasty, the balloon was removed. The 6 French Angiojet device was then inserted over the [place] wire and rheolytic thrombectomy/thrombolysis was performed throughout the region  of the fistula with internal thrombus. The Angiojet was then removed and a 7 x 40 mm Conquest balloon was advanced through the sheath an used to further balloon macerate and angioplasty the stick zone and proximal draining vein.  Following this, aspiration was performed through the sheath. There was blood flow however it was extremely week. Therefore, using similar technique to above, ultrasound-guided puncture of the draining vein in retrograde fashion directed toward the arterial anastomosis was performed with a 21 gauge micropuncture needle. An image was obtained and stored for the medical record. Using the transitional micro sheath, a working 6 [place] vascular sheath was advanced into the vein. A Kumpe the catheter and Glidewire were used to advanced into the arterial anastomosis. A gentle hand injection of contrast material demonstrates stasis within the inflow artery secondary to the 5 French catheter being occlusive. This was confirmed is the catheter was gently removed. Once back within the main draining vein the contrast column slowly washed out into the fistula. There is no distal flow which represents an interval change compared to October of 2014. Unfortunately, the inflow radial artery is extremely atretic. There is no outflow distal to the stenosis. The vessel measures approximately 2 mm in diameter.  Attempts were made to gently catheterize the very small radial artery, however the 5 French catheter was occlusive and a wire could not make the 90 degree bend. Given the very poor arterial inflow, the chance of obtaining sustained patency of this fistula is near 0. Therefore, further intervention was aborted.   Attention was turned to the right neck for placement of a tunneled hemodialysis catheter.  The right neck was interrogated with ultrasound. The right internal jugular vein is atretic and thick walled and cannot be visualized directly to the subclavian vein. The subclavian vein and external jugular vein are slightly hyper trophic. 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 As the subclavian vein was easily visible from a supraclavicular approach, it was decided to place the catheter directly into this vessel. Local anesthesia was attained by infiltration with 1% lidocaine. A small dermatotomy was made. Under real-time sonographic guidance (an image was obtained and stored for the medical record) the vessels punctured with a 21 gauge micropuncture needle. With the assistance of a 5 French micro sheath, a 0.018 inch wire was advanced into the heart in the intravascular depth measured. The micro wire was then removed and a 0.035 inch wire was advanced through the heart and into the inferior vena cava.  A suitable skin exit site inferior to the clavicle was selected. Local anesthesia was attained by 1% lidocaine infiltration. A small dermatotomy was made. A 19 cm tip to cuff Bard hemo split hemodialysis catheter was then tunneled from the skin exit site to the dermatotomy overlying the venous access site. A peel-away sheath was then advanced over the Bentson wire and into the right heart. The tunneled hemodialysis catheter was then fed through the peel-away sheath and the tips positioned in the upper right atrium under fluoroscopic imaging. An image was obtained and stored.  The catheter flushed and aspirated with ease and was locked with heparinized saline. The catheter was then secured to the skin with 0 Prolene suture and a sterile bandage. The dermatotomy over the venous access site was sealed with Dermabond. The patient tolerated the procedure well.  COMPLICATIONS: None  IMPRESSION: 1. Angioplasty of right  subclavian venous stenosis to 10 mm. 2. Unsuccessful declot procedure secondary to very poor arterial inflow from a small,  atherosclerotic and atretic radial artery. 3. Successful placement of a right IJ approach 19 cm tip to cuff tunneled hemodialysis catheter. Catheter tips are in the upper right atrium and ready for immediate use.  PLAN: 1. Recommend referral to vascular surgery for revision of the right upper extremity arteriovenous fistula. Signed,  Sterling Big, MD  [facility]  [facility]iology   Electronically Signed   By: Malachy Moan M.D.   On: 07/15/2014 17:16    ASSESSMENT / PLAN:     R Fem HD cath 2/22 >>> R Fem A line 2/22 >>>2/24 discontinued Hypotension -  Has resolved after gentle hydration. Hypertension continue prn labetalol , Norvasc, lisinopril Trop mild elevation: 0.05      ESRD on dialysis M/W/F Uremia AG metabolic acidosis - due to uremia / renal failure- Resolved Hyponatremia - Resolved Mild hyperkalemia - received kayxelate in ED Nephrology following - CRRT stopped 2/23; last hemodialysis 2/24 IR following: Status post thrombolysis of Rt arm dialysis fistula- and possible pta/stent placement on 2/24 Status post INSERTION OF ARTERIOVENOUS (AV) GORE-TEX GRAFT (86mmx40cm) LEFT THIGH on 2/26  Stable. Postoperatively   ? Pancreatitis - lipase 105 + generalized abd pain - hypoperfusion likley Diarrhea-improving Repeat lipase improved 97 (2/23) < 399  C. difficile negative on 2/25 Continue Imodium    Anemia  Esrd, r/o losses Thrombocytopenia VTE Prophylaxis Transfuse per usual ICU guidelines; s/p 1u pRBC 2/24 Hemoglobin 6.6> 9.1> 8.2, platelets stable SCD's / Heparin.(no bleeding noted) epo per renal CBC in AM. Positive FOBT Continue Protonix      AT RISK for vascular infection, infected clot Monitor clinically. Pct 2.46 Low threshold for empiric ABX, ceftaz, vanc (will dose) This is the patient  group that fly under radar and difficult to detect sepsis BCx NGTD Abx Vanc/Ceftaz 2/23 (1pm) 2 days, now off antibiotics     Diabetes  Continue SSI Hold outpatient glipizide.     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 Low dose fentanyl PRN.   Family updated: None. Disposition-PT eval    Richarda Overlie, MD,  Pager [phone] 07/17/2014 1:49 PM  ]

## 2014-07-17 NOTE — Interval H&P Note (Signed)
History and Physical Interval Note:  07/17/2014 9:03 AM  Theodore Hurley  has presented today for surgery, with the diagnosis of Clotted arteriovenous fistula   The various methods of treatment have been discussed with the patient and family. After consideration of risks, benefits and other options for treatment, the patient has consented to  Procedure(s): INSERTION OF ARTERIOVENOUS (AV) GORE-TEX GRAFT LEFT THIGH (Left) as a surgical intervention .  The patient's history has been reviewed, patient examined, no change in status, stable for surgery.  I have reviewed the patient's chart and labs.  Questions were answered to the patient's satisfaction.     Josephina GipLAWSON, Nycole Kawahara

## 2014-07-17 NOTE — Anesthesia Procedure Notes (Signed)
Procedure Name: LMA Insertion Date/Time: 07/17/2014 9:42 AM Performed by: Brien MatesMAHONY, Jillann Charette D Pre-anesthesia Checklist: Patient identified, Emergency Drugs available, Suction available, Patient being monitored and Timeout performed Patient Re-evaluated:Patient Re-evaluated prior to inductionOxygen Delivery Method: Circle system utilized Preoxygenation: Pre-oxygenation with 100% oxygen Intubation Type: IV induction Ventilation: Mask ventilation without difficulty LMA: LMA inserted LMA Size: 4.0 Number of attempts: 1 Placement Confirmation: positive ETCO2 and breath sounds checked- equal and bilateral Tube secured with: Tape Dental Injury: Teeth and Oropharynx as per pre-operative assessment

## 2014-07-17 NOTE — Op Note (Signed)
OPERATIVE REPORT  Date of Surgery: 07/13/2014 - 07/17/2014  Surgeon: Josephina GipJames Sidra Oldfield, MD  Assistant: Karsten RoKim Trinh PA  Pre-op Diagnosis-end-stage renal disease Post-op Diagnosis: Same  Procedure: Procedure(s): INSERTION OF ARTERIOVENOUS (AV) GORE-TEX GRAFT (266mmx40cm) LEFT THIGH from superficial femoral artery to common femoral vein  Anesthesia: Gen. endotracheal  EBL: Minimal  Complications: None  Procedure Details: The patient was taken the operative placed in supine position at which time satisfactory general endotracheal anesthesia was administered. Left leg was prepped Betadine scrub and solution draped in routine sterile manner. Short longitudinal incision was made just distal to the inguinal crease and carried down through subcutaneous talus tissue superficial femoral artery was easily identified and was mildly diseased but had an excellent pulse area it was dissected up to the profunda which was a relatively small vessel. Common femoral vein was identified and was mobilized proximally and distally for about 5-6 cm and no saphenofemoral junction was identified. Saphenous vein was not identified in the subcutaneous tissue. Was decided to insert the graft into the common femoral vein.   a curvilinear tunnel was then created using a small counterincision at the apex of the loop and a 6 mm x 40 cm Gore-Tex graft liver through the tunnel. No heparin was given. Superficial femoral artery was occluded proximally and distally just distal to its origin open 15 blade extended with Potts scissors. Gore-Tex is very slightly spatulated and anastomosed inside with 6-0 Prolene. Clamps were then released there was excellent Doppler flow in the superficial femoral artery. Similarly the femoral vein was occluded proximally and distally opened longitudinally with 15 blade extended with Potts scissors. The Gore-Tex was very slightly spatulated and anastomosed end to side the common femoral vein 6-0 Prolene. Clamps  released nose good pulse and thrill in the graft and excellent Doppler flow in the superficial femoral artery with graft open. No protamine was given and no heparin was given. Adequate hemostasis was achieved and wounds were both closed in layers with Vicryl subcuticular fashion with Dermabond patient taken stable condition  Josephina GipJames Daija Routson, MD 07/17/2014 10:50 AM

## 2014-07-17 NOTE — Transfer of Care (Signed)
Immediate Anesthesia Transfer of Care Note  Patient: Theodore AhmadiMiguel L Hurley  Procedure(s) Performed: Procedure(s): INSERTION OF ARTERIOVENOUS (AV) GORE-TEX GRAFT (356mmx40cm) LEFT THIGH (Left)  Patient Location: PACU  Anesthesia Type:General  Level of Consciousness: awake  Airway & Oxygen Therapy: Patient Spontanous Breathing and Patient connected to face mask oxygen  Post-op Assessment: Report given to RN and Post -op Vital signs reviewed and stable  Post vital signs: Reviewed and stable  Last Vitals:  Filed Vitals:   07/17/14 0414  BP: 109/29  Pulse: 64  Temp: 36.9 C  Resp: 17    Complications: No apparent anesthesia complications

## 2014-07-17 NOTE — Progress Notes (Signed)
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   S/p Left groin AV access  HD in progress. No hemodialysis issues.   Subjective: Interval History: S/P Left groin AVGG today  Objective: Vital signs in last 24 hours: Temp:  [97.9 F (36.6 C)-98.8 F (37.1 C)] 98.1 F (36.7 C) (02/26 1059) Pulse Rate:  [64-74] 66 (02/26 1145) Resp:  [14-19] 14 (02/26 1145) BP: (109-160)/(29-76) 127/31 mmHg (02/26 1145) SpO2:  [100 %] 100 % (02/26 1145) Weight:  [83.9 kg (184 lb 15.5 oz)] 83.9 kg (184 lb 15.5 oz) (02/25 2111) Weight change: -0.6 kg (-1 lb 5.2 oz)  Intake/Output from previous day: 02/25 0701 - 02/26 0700 In: 1040 [P.O.:1040] Out: 0  Intake/Output this shift: Total I/O In: 700 [I.V.:700] Out: 25 [Blood:25]   Lab Results:  Recent Labs  07/15/14 0420 07/15/14 2330  WBC 5.2 5.8  HGB 6.6* 9.1*  HCT 18.5* 26.5*  PLT 130* 141*   BMET:  Recent Labs  07/14/14 1600 07/15/14 0420  NA 133* 135  K 3.3* 3.6  CL 104 101  CO2 19 20  GLUCOSE 195* 111*  BUN 82* 71*  CREATININE 11.88* 11.88*  CALCIUM 8.3* 8.5   No results for input(s): PTH in the last 72 hours. Iron Studies: No results for input(s): IRON, TIBC, TRANSFERRIN, FERRITIN in the last 72 hours. Studies/Results: Ir Pta Venous Right  07/15/2014   CLINICAL DATA:  54 year old male with end-stage renal disease on hemodialysis. His right forearm  EXAM: IR RIGHT DECLOT; PTA VENOUS; ARTERIOVENOUS SHUNT FOR DIALYSIS; ADDITIONAL ACCESS; IR ULTRASOUND GUIDANCE VASC ACCESS RIGHT; IR RIGHT FLOURO GUIDE CV LINE  Date: 07/15/2014  PROCEDURE: 1. Ultrasound-guided antegrade puncture adjacent to the arterial anastomosis 2. Ultrasound-guided retrograde puncture 3. Catheterization of the right subclavian vein with central and pull-back venogram 4. 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 Pharmacomechanical thrombolysis/thrombectomy declot  procedure 6. Attempted catheterization of the arterial inflow with limited arteriogram 7. Ultrasound-guided puncture of the right subclavian vein from a supraclavicular approach 8. Placement of a 19 cm tip to cuff tunneled hemodialysis catheter Interventional Radiologist:  Sterling Big, MD  ANESTHESIA/SEDATION: Moderate (conscious) sedation was used. 2 mg Versed, 100 mcg Fentanyl were administered intravenously. The patient's vital signs were monitored continuously by radiology nursing throughout the procedure.  Sedation Time: 80 minutes  MEDICATIONS: 2 g Ancef and 3000 units heparin administered intravenously. Ancef was administered within 1 hours of skin incision.  FLUOROSCOPY TIME:  15 minutes 30 seconds  CONTRAST:  30mL OMNIPAQUE IOHEXOL 300 MG/ML  SOLN  TECHNIQUE: Informed consent was obtained from the patient following explanation of the procedure, risks, benefits and alternatives. The patient understands, agrees and consents for the procedure. All questions were addressed. A time out was performed.  Maximal barrier sterile technique utilized including caps, mask, sterile gowns, sterile gloves, large sterile drape, hand hygiene, and Betadine skin prep.  The right upper extremity arteriovenous fistula was interrogated with ultrasound. There is subocclusive clot throughout the main stick zone. The vein proximal and distal to this is patent and compressible. However, there is a very weak pulse suggesting possible inflow disease versus high-grade central stenosis.  Local anesthesia was attained by infiltration with 1% lidocaine. Under real-time sonographic guidance, the vein was punctured in antegrade fashion several cm from the arterial anastomosis. A transitional 5 French micro sheath was advanced over a micro wire into the stick zone. 2 mg of tPA was  then injected into the thrombus. This was manually macerated by gentle massage while the outflow vein was capped compressed with the ultrasound probe. 3000  units of heparin was injected intravenously.  The transitional micro sheath was then exchanged over a Bentson wire for a working 7 [place] vascular sheath. A Kumpe the catheter was advanced into the right subclavian vein and a central venogram was performed. 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 A pull-back venogram was then performed demonstrating patency of the cephalic arch and proximal cephalic vein.  A Rosen wire was next advanced into the superior vena cava and the subclavian vein was angioplasty to 10 mm using a 10 x 40 mm Conquest balloon. The inflation was maintained at 20 atmospheres with full effacement for 2 minutes. Following angioplasty, the balloon was removed. The 6 French Angiojet device was then inserted over the Henry wire and rheolytic thrombectomy/thrombolysis was performed throughout the region of the fistula with internal thrombus. The Angiojet was then removed and a 7 x 40 mm Conquest balloon was advanced through the sheath an used to further balloon macerate and angioplasty the stick zone and proximal draining vein.  Following this, aspiration was performed through the sheath. There was blood flow however it was extremely week. Therefore, using similar technique to above, ultrasound-guided puncture of the draining vein in retrograde fashion directed toward the arterial anastomosis was performed with a 21 gauge micropuncture needle. An image was obtained and stored for the medical record. Using the transitional micro sheath, a working 6 [place] vascular sheath was advanced into the vein. A Kumpe the catheter and Glidewire were used to advanced into the arterial anastomosis. A gentle hand injection of contrast material demonstrates stasis within the inflow artery secondary to the 5 French catheter being occlusive. This was confirmed is the catheter was gently removed. Once back within the main draining vein the contrast  column slowly washed out into the fistula. There is no distal flow which represents an interval change compared to October of 2014. Unfortunately, the inflow radial artery is extremely atretic. There is no outflow distal to the stenosis. The vessel measures approximately 2 mm in diameter.  Attempts were made to gently catheterize the very small radial artery, however the 5 French catheter was occlusive and a wire could not make the 90 degree bend. Given the very poor arterial inflow, the chance of obtaining sustained patency of this fistula is near 0. Therefore, further intervention was aborted.  Attention was turned to the right neck for placement of a tunneled hemodialysis catheter.  The right neck was interrogated with ultrasound. The right internal jugular vein is atretic and thick walled and cannot be visualized directly to the subclavian vein. The subclavian vein and external jugular vein are slightly hyper trophic. 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 As the subclavian vein was easily visible from a supraclavicular approach, it was decided to place the catheter directly into this vessel. Local anesthesia was attained by infiltration with 1% lidocaine. A small dermatotomy was made. Under real-time sonographic guidance (an image was obtained and stored for the medical record) the vessels punctured with a 21 gauge micropuncture needle. With the assistance of a 5 French micro sheath, a 0.018 inch wire was advanced into the heart in the intravascular depth measured. The micro wire was then removed and a 0.035 inch wire was advanced through the heart and into the inferior vena cava.  A  suitable skin exit site inferior to the clavicle was selected. Local anesthesia was attained by 1% lidocaine infiltration. A small dermatotomy was made. A 19 cm tip to cuff Bard hemo split hemodialysis catheter was then tunneled from the skin exit site to the dermatotomy overlying the venous access site. A peel-away sheath  was then advanced over the Bentson wire and into the right heart. The tunneled hemodialysis catheter was then fed through the peel-away sheath and the tips positioned in the upper right atrium under fluoroscopic imaging. An image was obtained and stored.  The catheter flushed and aspirated with ease and was locked with heparinized saline. The catheter was then secured to the skin with 0 Prolene suture and a sterile bandage. The dermatotomy over the venous access site was sealed with Dermabond. The patient tolerated the procedure well.  COMPLICATIONS: None  IMPRESSION: 1. 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 2. Unsuccessful declot procedure secondary to very poor arterial inflow from a small, atherosclerotic and atretic radial artery. 3. Successful placement of a right IJ approach 19 cm tip to cuff tunneled hemodialysis catheter. Catheter tips are in the upper right atrium and ready for immediate use.  PLAN: 1. Recommend referral to vascular surgery for revision of the right upper extremity arteriovenous fistula. Signed,  Sterling Big, MD  [facility]  [facility]   Electronically Signed   By: Malachy Moan M.D.   On: 07/15/2014 17:16   Ir Pta Venous Right  07/15/2014   CLINICAL DATA:  54 year old male with end-stage renal disease on hemodialysis. His right forearm  EXAM: IR RIGHT DECLOT; PTA VENOUS; ARTERIOVENOUS SHUNT FOR DIALYSIS; ADDITIONAL ACCESS; IR ULTRASOUND GUIDANCE VASC ACCESS RIGHT; IR RIGHT FLOURO GUIDE CV LINE  Date: 07/15/2014  PROCEDURE: 1. Ultrasound-guided antegrade puncture adjacent to the arterial anastomosis 2. Ultrasound-guided retrograde puncture 3. Catheterization of the right subclavian vein with central and pull-back venogram 4. 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 Pharmacomechanical thrombolysis/thrombectomy declot procedure 6. Attempted catheterization of the arterial inflow with limited  arteriogram 7. Ultrasound-guided puncture of the right subclavian vein from a supraclavicular approach 8. Placement of a 19 cm tip to cuff tunneled hemodialysis catheter Interventional Radiologist:  Sterling Big, MD  ANESTHESIA/SEDATION: Moderate (conscious) sedation was used. 2 mg Versed, 100 mcg Fentanyl were administered intravenously. The patient's vital signs were monitored continuously by radiology nursing throughout the procedure.  Sedation Time: 80 minutes  MEDICATIONS: 2 g Ancef and 3000 units heparin administered intravenously. Ancef was administered within 1 hours of skin incision.  FLUOROSCOPY TIME:  15 minutes 30 seconds  CONTRAST:  30mL OMNIPAQUE IOHEXOL 300 MG/ML  SOLN  TECHNIQUE: Informed consent was obtained from the patient following explanation of the procedure, risks, benefits and alternatives. The patient understands, agrees and consents for the procedure. All questions were addressed. A time out was performed.  Maximal barrier sterile technique utilized including caps, mask, sterile gowns, sterile gloves, large sterile drape, hand hygiene, and Betadine skin prep.  The right upper extremity arteriovenous fistula was interrogated with ultrasound. There is subocclusive clot throughout the main stick zone. The vein proximal and distal to this is patent and compressible. However, there is a very weak pulse suggesting possible inflow disease versus high-grade central stenosis.  Local anesthesia was attained by infiltration with 1% lidocaine. Under real-time sonographic guidance, the vein was punctured in antegrade fashion several cm from the arterial anastomosis. A transitional 5 French micro sheath was advanced over a  micro wire into the stick zone. 2 mg of tPA was then injected into the thrombus. This was manually macerated by gentle massage while the outflow vein was capped compressed with the ultrasound probe. 3000 units of heparin was injected intravenously.  The transitional micro sheath  was then exchanged over a Bentson wire for a working 7 [place] vascular sheath. A Kumpe the catheter was advanced into the right subclavian vein and a central venogram was performed. 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 A pull-back venogram was then performed demonstrating patency of the cephalic arch and proximal cephalic vein.  A Rosen wire was next advanced into the superior vena cava and the subclavian vein was angioplasty to 10 mm using a 10 x 40 mm Conquest balloon. The inflation was maintained at 20 atmospheres with full effacement for 2 minutes. Following angioplasty, the balloon was removed. The 6 French Angiojet device was then inserted over the [place] wire and rheolytic thrombectomy/thrombolysis was performed throughout the region of the fistula with internal thrombus. The Angiojet was then removed and a 7 x 40 mm Conquest balloon was advanced through the sheath an used to further balloon macerate and angioplasty the stick zone and proximal draining vein.  Following this, aspiration was performed through the sheath. There was blood flow however it was extremely week. Therefore, using similar technique to above, ultrasound-guided puncture of the draining vein in retrograde fashion directed toward the arterial anastomosis was performed with a 21 gauge micropuncture needle. An image was obtained and stored for the medical record. Using the transitional micro sheath, a working 6 [place] vascular sheath was advanced into the vein. A Kumpe the catheter and Glidewire were used to advanced into the arterial anastomosis. A gentle hand injection of contrast material demonstrates stasis within the inflow artery secondary to the 5 French catheter being occlusive. This was confirmed is the catheter was gently removed. Once back within the main draining vein the contrast column slowly washed out into the fistula. There is no distal flow which  represents an interval change compared to October of 2014. Unfortunately, the inflow radial artery is extremely atretic. There is no outflow distal to the stenosis. The vessel measures approximately 2 mm in diameter.  Attempts were made to gently catheterize the very small radial artery, however the 5 French catheter was occlusive and a wire could not make the 90 degree bend. Given the very poor arterial inflow, the chance of obtaining sustained patency of this fistula is near 0. Therefore, further intervention was aborted.  Attention was turned to the right neck for placement of a tunneled hemodialysis catheter.  The right neck was interrogated with ultrasound. The right internal jugular vein is atretic and thick walled and cannot be visualized directly to the subclavian vein. The subclavian vein and external jugular vein are slightly hyper trophic. 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 As the subclavian vein was easily visible from a supraclavicular approach, it was decided to place the catheter directly into this vessel. Local anesthesia was attained by infiltration with 1% lidocaine. A small dermatotomy was made. Under real-time sonographic guidance (an image was obtained and stored for the medical record) the vessels punctured with a 21 gauge micropuncture needle. With the assistance of a 5 French micro sheath, a 0.018 inch wire was advanced into the heart in the intravascular depth measured. The micro wire was then removed and a 0.035 inch wire was advanced  through the heart and into the inferior vena cava.  A suitable skin exit site inferior to the clavicle was selected. Local anesthesia was attained by 1% lidocaine infiltration. A small dermatotomy was made. A 19 cm tip to cuff Bard hemo split hemodialysis catheter was then tunneled from the skin exit site to the dermatotomy overlying the venous access site. A peel-away sheath was then advanced over the Bentson wire and into the right heart. The  tunneled hemodialysis catheter was then fed through the peel-away sheath and the tips positioned in the upper right atrium under fluoroscopic imaging. An image was obtained and stored.  The catheter flushed and aspirated with ease and was locked with heparinized saline. The catheter was then secured to the skin with 0 Prolene suture and a sterile bandage. The dermatotomy over the venous access site was sealed with Dermabond. The patient tolerated the procedure well.  COMPLICATIONS: None  IMPRESSION: 1. 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 2. Unsuccessful declot procedure secondary to very poor arterial inflow from a small, atherosclerotic and atretic radial artery. 3. Successful placement of a right IJ approach 19 cm tip to cuff tunneled hemodialysis catheter. Catheter tips are in the upper right atrium and ready for immediate use.  PLAN: 1. Recommend referral to vascular surgery for revision of the right upper extremity arteriovenous fistula. Signed,  Sterling Big, MD  [facility]  [facility]   Electronically Signed   By: Malachy Moan M.D.   On: 07/15/2014 17:16   Ir Fluoro Guide Cv Line Right  07/15/2014   CLINICAL DATA:  54 year old male with end-stage renal disease on hemodialysis. His right forearm  EXAM: IR RIGHT DECLOT; PTA VENOUS; ARTERIOVENOUS SHUNT FOR DIALYSIS; ADDITIONAL ACCESS; IR ULTRASOUND GUIDANCE VASC ACCESS RIGHT; IR RIGHT FLOURO GUIDE CV LINE  Date: 07/15/2014  PROCEDURE: 1. Ultrasound-guided antegrade puncture adjacent to the arterial anastomosis 2. Ultrasound-guided retrograde puncture 3. Catheterization of the right subclavian vein with central and pull-back venogram 4. 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 Pharmacomechanical thrombolysis/thrombectomy declot procedure 6. Attempted catheterization of the arterial inflow with limited arteriogram 7. Ultrasound-guided puncture of the right subclavian  vein from a supraclavicular approach 8. Placement of a 19 cm tip to cuff tunneled hemodialysis catheter Interventional Radiologist:  Sterling Big, MD  ANESTHESIA/SEDATION: Moderate (conscious) sedation was used. 2 mg Versed, 100 mcg Fentanyl were administered intravenously. The patient's vital signs were monitored continuously by radiology nursing throughout the procedure.  Sedation Time: 80 minutes  MEDICATIONS: 2 g Ancef and 3000 units heparin administered intravenously. Ancef was administered within 1 hours of skin incision.  FLUOROSCOPY TIME:  15 minutes 30 seconds  CONTRAST:  30mL OMNIPAQUE IOHEXOL 300 MG/ML  SOLN  TECHNIQUE: Informed consent was obtained from the patient following explanation of the procedure, risks, benefits and alternatives. The patient understands, agrees and consents for the procedure. All questions were addressed. A time out was performed.  Maximal barrier sterile technique utilized including caps, mask, sterile gowns, sterile gloves, large sterile drape, hand hygiene, and Betadine skin prep.  The right upper extremity arteriovenous fistula was interrogated with ultrasound. There is subocclusive clot throughout the main stick zone. The vein proximal and distal to this is patent and compressible. However, there is a very weak pulse suggesting possible inflow disease versus high-grade central stenosis.  Local anesthesia was attained by infiltration with 1% lidocaine. Under real-time sonographic guidance, the vein was punctured in antegrade fashion several cm from  the arterial anastomosis. A transitional 5 French micro sheath was advanced over a micro wire into the stick zone. 2 mg of tPA was then injected into the thrombus. This was manually macerated by gentle massage while the outflow vein was capped compressed with the ultrasound probe. 3000 units of heparin was injected intravenously.  The transitional micro sheath was then exchanged over a Bentson wire for a working 7 [place]  vascular sheath. A Kumpe the catheter was advanced into the right subclavian vein and a central venogram was performed. 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 A pull-back venogram was then performed demonstrating patency of the cephalic arch and proximal cephalic vein.  A Rosen wire was next advanced into the superior vena cava and the subclavian vein was angioplasty to 10 mm using a 10 x 40 mm Conquest balloon. The inflation was maintained at 20 atmospheres with full effacement for 2 minutes. Following angioplasty, the balloon was removed. The 6 French Angiojet device was then inserted over the [place] wire and rheolytic thrombectomy/thrombolysis was performed throughout the region of the fistula with internal thrombus. The Angiojet was then removed and a 7 x 40 mm Conquest balloon was advanced through the sheath an used to further balloon macerate and angioplasty the stick zone and proximal draining vein.  Following this, aspiration was performed through the sheath. There was blood flow however it was extremely week. Therefore, using similar technique to above, ultrasound-guided puncture of the draining vein in retrograde fashion directed toward the arterial anastomosis was performed with a 21 gauge micropuncture needle. An image was obtained and stored for the medical record. Using the transitional micro sheath, a working 6 [place] vascular sheath was advanced into the vein. A Kumpe the catheter and Glidewire were used to advanced into the arterial anastomosis. A gentle hand injection of contrast material demonstrates stasis within the inflow artery secondary to the 5 French catheter being occlusive. This was confirmed is the catheter was gently removed. Once back within the main draining vein the contrast column slowly washed out into the fistula. There is no distal flow which represents an interval change compared to October of 2014.  Unfortunately, the inflow radial artery is extremely atretic. There is no outflow distal to the stenosis. The vessel measures approximately 2 mm in diameter.  Attempts were made to gently catheterize the very small radial artery, however the 5 French catheter was occlusive and a wire could not make the 90 degree bend. Given the very poor arterial inflow, the chance of obtaining sustained patency of this fistula is near 0. Therefore, further intervention was aborted.  Attention was turned to the right neck for placement of a tunneled hemodialysis catheter.  The right neck was interrogated with ultrasound. The right internal jugular vein is atretic and thick walled and cannot be visualized directly to the subclavian vein. The subclavian vein and external jugular vein are slightly hyper trophic. 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 As the subclavian vein was easily visible from a supraclavicular approach, it was decided to place the catheter directly into this vessel. Local anesthesia was attained by infiltration with 1% lidocaine. A small dermatotomy was made. Under real-time sonographic guidance (an image was obtained and stored for the medical record) the vessels punctured with a 21 gauge micropuncture needle. With the assistance of a 5 French micro sheath, a 0.018 inch wire was advanced into the heart in the intravascular depth measured.  The micro wire was then removed and a 0.035 inch wire was advanced through the heart and into the inferior vena cava.  A suitable skin exit site inferior to the clavicle was selected. Local anesthesia was attained by 1% lidocaine infiltration. A small dermatotomy was made. A 19 cm tip to cuff Bard hemo split hemodialysis catheter was then tunneled from the skin exit site to the dermatotomy overlying the venous access site. A peel-away sheath was then advanced over the Bentson wire and into the right heart. The tunneled hemodialysis catheter was then fed through the  peel-away sheath and the tips positioned in the upper right atrium under fluoroscopic imaging. An image was obtained and stored.  The catheter flushed and aspirated with ease and was locked with heparinized saline. The catheter was then secured to the skin with 0 Prolene suture and a sterile bandage. The dermatotomy over the venous access site was sealed with Dermabond. The patient tolerated the procedure well.  COMPLICATIONS: None  IMPRESSION: 1. 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 2. Unsuccessful declot procedure secondary to very poor arterial inflow from a small, atherosclerotic and atretic radial artery. 3. Successful placement of a right IJ approach 19 cm tip to cuff tunneled hemodialysis catheter. Catheter tips are in the upper right atrium and ready for immediate use.  PLAN: 1. Recommend referral to vascular surgery for revision of the right upper extremity arteriovenous fistula. Signed,  Sterling Big, MD  [facility]  [facility]iology   Electronically Signed   By: Malachy Moan M.D.   On: 07/15/2014 17:16   Ir Angio Av Shunt Addl Access  07/15/2014   CLINICAL DATA:  54 year old male with end-stage renal disease on hemodialysis. His right forearm  EXAM: IR RIGHT DECLOT; PTA VENOUS; ARTERIOVENOUS SHUNT FOR DIALYSIS; ADDITIONAL ACCESS; IR ULTRASOUND GUIDANCE VASC ACCESS RIGHT; IR RIGHT FLOURO GUIDE CV LINE  Date: 07/15/2014  PROCEDURE: 1. Ultrasound-guided antegrade puncture adjacent to the arterial anastomosis 2. Ultrasound-guided retrograde puncture 3. Catheterization of the right subclavian vein with central and pull-back venogram 4. 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 Pharmacomechanical thrombolysis/thrombectomy declot procedure 6. Attempted catheterization of the arterial inflow with limited arteriogram 7. Ultrasound-guided puncture of the right subclavian vein from a supraclavicular approach 8. Placement of a  19 cm tip to cuff tunneled hemodialysis catheter Interventional Radiologist:  Sterling Big, MD  ANESTHESIA/SEDATION: Moderate (conscious) sedation was used. 2 mg Versed, 100 mcg Fentanyl were administered intravenously. The patient's vital signs were monitored continuously by radiology nursing throughout the procedure.  Sedation Time: 80 minutes  MEDICATIONS: 2 g Ancef and 3000 units heparin administered intravenously. Ancef was administered within 1 hours of skin incision.  FLUOROSCOPY TIME:  15 minutes 30 seconds  CONTRAST:  30mL OMNIPAQUE IOHEXOL 300 MG/ML  SOLN  TECHNIQUE: Informed consent was obtained from the patient following explanation of the procedure, risks, benefits and alternatives. The patient understands, agrees and consents for the procedure. All questions were addressed. A time out was performed.  Maximal barrier sterile technique utilized including caps, mask, sterile gowns, sterile gloves, large sterile drape, hand hygiene, and Betadine skin prep.  The right upper extremity arteriovenous fistula was interrogated with ultrasound. There is subocclusive clot throughout the main stick zone. The vein proximal and distal to this is patent and compressible. However, there is a very weak pulse suggesting possible inflow disease versus high-grade central stenosis.  Local anesthesia was attained by infiltration with 1% lidocaine. Under  real-time sonographic guidance, the vein was punctured in antegrade fashion several cm from the arterial anastomosis. A transitional 5 French micro sheath was advanced over a micro wire into the stick zone. 2 mg of tPA was then injected into the thrombus. This was manually macerated by gentle massage while the outflow vein was capped compressed with the ultrasound probe. 3000 units of heparin was injected intravenously.  The transitional micro sheath was then exchanged over a Bentson wire for a working 7 [place] vascular sheath. A Kumpe the catheter was advanced into  the right subclavian vein and a central venogram was performed. 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 A pull-back venogram was then performed demonstrating patency of the cephalic arch and proximal cephalic vein.  A Rosen wire was next advanced into the superior vena cava and the subclavian vein was angioplasty to 10 mm using a 10 x 40 mm Conquest balloon. The inflation was maintained at 20 atmospheres with full effacement for 2 minutes. Following angioplasty, the balloon was removed. The 6 French Angiojet device was then inserted over the [place] wire and rheolytic thrombectomy/thrombolysis was performed throughout the region of the fistula with internal thrombus. The Angiojet was then removed and a 7 x 40 mm Conquest balloon was advanced through the sheath an used to further balloon macerate and angioplasty the stick zone and proximal draining vein.  Following this, aspiration was performed through the sheath. There was blood flow however it was extremely week. Therefore, using similar technique to above, ultrasound-guided puncture of the draining vein in retrograde fashion directed toward the arterial anastomosis was performed with a 21 gauge micropuncture needle. An image was obtained and stored for the medical record. Using the transitional micro sheath, a working 6 [place] vascular sheath was advanced into the vein. A Kumpe the catheter and Glidewire were used to advanced into the arterial anastomosis. A gentle hand injection of contrast material demonstrates stasis within the inflow artery secondary to the 5 French catheter being occlusive. This was confirmed is the catheter was gently removed. Once back within the main draining vein the contrast column slowly washed out into the fistula. There is no distal flow which represents an interval change compared to October of 2014. Unfortunately, the inflow radial artery is extremely atretic.  There is no outflow distal to the stenosis. The vessel measures approximately 2 mm in diameter.  Attempts were made to gently catheterize the very small radial artery, however the 5 French catheter was occlusive and a wire could not make the 90 degree bend. Given the very poor arterial inflow, the chance of obtaining sustained patency of this fistula is near 0. Therefore, further intervention was aborted.  Attention was turned to the right neck for placement of a tunneled hemodialysis catheter.  The right neck was interrogated with ultrasound. The right internal jugular vein is atretic and thick walled and cannot be visualized directly to the subclavian vein. The subclavian vein and external jugular vein are slightly hyper trophic. 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 As the subclavian vein was easily visible from a supraclavicular approach, it was decided to place the catheter directly into this vessel. Local anesthesia was attained by infiltration with 1% lidocaine. A small dermatotomy was made. Under real-time sonographic guidance (an image was obtained and stored for the medical record) the vessels punctured with a 21 gauge micropuncture needle. With the assistance of a 5 [place] micro sheath, a  0.018 inch wire was advanced into the heart in the intravascular depth measured. The micro wire was then removed and a 0.035 inch wire was advanced through the heart and into the inferior vena cava.  A suitable skin exit site inferior to the clavicle was selected. Local anesthesia was attained by 1% lidocaine infiltration. A small dermatotomy was made. A 19 cm tip to cuff Bard hemo split hemodialysis catheter was then tunneled from the skin exit site to the dermatotomy overlying the venous access site. A peel-away sheath was then advanced over the Bentson wire and into the right heart. The tunneled hemodialysis catheter was then fed through the peel-away sheath and the tips positioned in the upper right atrium  under fluoroscopic imaging. An image was obtained and stored.  The catheter flushed and aspirated with ease and was locked with heparinized saline. The catheter was then secured to the skin with 0 Prolene suture and a sterile bandage. The dermatotomy over the venous access site was sealed with Dermabond. The patient tolerated the procedure well.  COMPLICATIONS: None  IMPRESSION: 1. 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 2. Unsuccessful declot procedure secondary to very poor arterial inflow from a small, atherosclerotic and atretic radial artery. 3. Successful placement of a right IJ approach 19 cm tip to cuff tunneled hemodialysis catheter. Catheter tips are in the upper right atrium and ready for immediate use.  PLAN: 1. Recommend referral to vascular surgery for revision of the right upper extremity arteriovenous fistula. Signed,  Sterling BigHeath K. McCullough, MD  [facility]  [facility]   Electronically Signed   By: Malachy MoanHeath  McCullough M.D.   On: 07/15/2014 17:16   Ir [place] Guide Vasc Access Right  07/15/2014   CLINICAL DATA:  54 year old male with end-stage renal disease on hemodialysis. His right forearm  EXAM: IR RIGHT DECLOT; PTA VENOUS; ARTERIOVENOUS SHUNT FOR DIALYSIS; ADDITIONAL ACCESS; IR ULTRASOUND GUIDANCE VASC ACCESS RIGHT; IR RIGHT FLOURO GUIDE CV LINE  Date: 07/15/2014  PROCEDURE: 1. Ultrasound-guided antegrade puncture adjacent to the arterial anastomosis 2. Ultrasound-guided retrograde puncture 3. Catheterization of the right subclavian vein with central and pull-back venogram 4. 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 Pharmacomechanical thrombolysis/thrombectomy declot procedure 6. Attempted catheterization of the arterial inflow with limited arteriogram 7. Ultrasound-guided puncture of the right subclavian vein from a supraclavicular approach 8. Placement of a 19 cm tip to cuff tunneled hemodialysis catheter Interventional  Radiologist:  Sterling BigHeath K. McCullough, MD  ANESTHESIA/SEDATION: Moderate (conscious) sedation was used. 2 mg Versed, 100 mcg Fentanyl were administered intravenously. The patient's vital signs were monitored continuously by radiology nursing throughout the procedure.  Sedation Time: 80 minutes  MEDICATIONS: 2 g Ancef and 3000 units heparin administered intravenously. Ancef was administered within 1 hours of skin incision.  FLUOROSCOPY TIME:  15 minutes 30 seconds  CONTRAST:  30mL OMNIPAQUE IOHEXOL 300 MG/ML  SOLN  TECHNIQUE: Informed consent was obtained from the patient following explanation of the procedure, risks, benefits and alternatives. The patient understands, agrees and consents for the procedure. All questions were addressed. A time out was performed.  Maximal barrier sterile technique utilized including caps, mask, sterile gowns, sterile gloves, large sterile drape, hand hygiene, and Betadine skin prep.  The right upper extremity arteriovenous fistula was interrogated with ultrasound. There is subocclusive clot throughout the main stick zone. The vein proximal and distal to this is patent and compressible. However, there is a very weak pulse suggesting possible inflow disease versus high-grade  central stenosis.  Local anesthesia was attained by infiltration with 1% lidocaine. Under real-time sonographic guidance, the vein was punctured in antegrade fashion several cm from the arterial anastomosis. A transitional 5 French micro sheath was advanced over a micro wire into the stick zone. 2 mg of tPA was then injected into the thrombus. This was manually macerated by gentle massage while the outflow vein was capped compressed with the ultrasound probe. 3000 units of heparin was injected intravenously.  The transitional micro sheath was then exchanged over a Bentson wire for a working 7 [place] vascular sheath. A Kumpe the catheter was advanced into the right subclavian vein and a central venogram was performed.  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 A pull-back venogram was then performed demonstrating patency of the cephalic arch and proximal cephalic vein.  A Rosen wire was next advanced into the superior vena cava and the subclavian vein was angioplasty to 10 mm using a 10 x 40 mm Conquest balloon. The inflation was maintained at 20 atmospheres with full effacement for 2 minutes. Following angioplasty, the balloon was removed. The 6 French Angiojet device was then inserted over the [place] wire and rheolytic thrombectomy/thrombolysis was performed throughout the region of the fistula with internal thrombus. The Angiojet was then removed and a 7 x 40 mm Conquest balloon was advanced through the sheath an used to further balloon macerate and angioplasty the stick zone and proximal draining vein.  Following this, aspiration was performed through the sheath. There was blood flow however it was extremely week. Therefore, using similar technique to above, ultrasound-guided puncture of the draining vein in retrograde fashion directed toward the arterial anastomosis was performed with a 21 gauge micropuncture needle. An image was obtained and stored for the medical record. Using the transitional micro sheath, a working 6 [place] vascular sheath was advanced into the vein. A Kumpe the catheter and Glidewire were used to advanced into the arterial anastomosis. A gentle hand injection of contrast material demonstrates stasis within the inflow artery secondary to the 5 French catheter being occlusive. This was confirmed is the catheter was gently removed. Once back within the main draining vein the contrast column slowly washed out into the fistula. There is no distal flow which represents an interval change compared to October of 2014. Unfortunately, the inflow radial artery is extremely atretic. There is no outflow distal to the stenosis. The vessel measures  approximately 2 mm in diameter.  Attempts were made to gently catheterize the very small radial artery, however the 5 French catheter was occlusive and a wire could not make the 90 degree bend. Given the very poor arterial inflow, the chance of obtaining sustained patency of this fistula is near 0. Therefore, further intervention was aborted.  Attention was turned to the right neck for placement of a tunneled hemodialysis catheter.  The right neck was interrogated with ultrasound. The right internal jugular vein is atretic and thick walled and cannot be visualized directly to the subclavian vein. The subclavian vein and external jugular vein are slightly hyper trophic. 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 As the subclavian vein was easily visible from a supraclavicular approach, it was decided to place the catheter directly into this vessel. Local anesthesia was attained by infiltration with 1% lidocaine. A small dermatotomy was made. Under real-time sonographic guidance (an image was obtained and stored for the medical record) the vessels punctured with a 21  gauge micropuncture needle. With the assistance of a 5 French micro sheath, a 0.018 inch wire was advanced into the heart in the intravascular depth measured. The micro wire was then removed and a 0.035 inch wire was advanced through the heart and into the inferior vena cava.  A suitable skin exit site inferior to the clavicle was selected. Local anesthesia was attained by 1% lidocaine infiltration. A small dermatotomy was made. A 19 cm tip to cuff Bard hemo split hemodialysis catheter was then tunneled from the skin exit site to the dermatotomy overlying the venous access site. A peel-away sheath was then advanced over the Bentson wire and into the right heart. The tunneled hemodialysis catheter was then fed through the peel-away sheath and the tips positioned in the upper right atrium under fluoroscopic imaging. An image was obtained and stored.   The catheter flushed and aspirated with ease and was locked with heparinized saline. The catheter was then secured to the skin with 0 Prolene suture and a sterile bandage. The dermatotomy over the venous access site was sealed with Dermabond. The patient tolerated the procedure well.  COMPLICATIONS: None  IMPRESSION: 1. 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 2. Unsuccessful declot procedure secondary to very poor arterial inflow from a small, atherosclerotic and atretic radial artery. 3. Successful placement of a right IJ approach 19 cm tip to cuff tunneled hemodialysis catheter. Catheter tips are in the upper right atrium and ready for immediate use.  PLAN: 1. Recommend referral to vascular surgery for revision of the right upper extremity arteriovenous fistula. Signed,  Sterling Big, MD  [facility]  [facility] Radiology   Electronically Signed   By: Malachy Moan M.D.   On: 07/15/2014 17:16   Ir US Guide Vasc Access Right  07/15/2014   CLINICAL DATA:  54 year old male with end-stage renal disease on hemodialysis. His right forearm  EXAM: IR RIGHT DECLOT; PTA VENOUS; ARTERIOVENOUS SHUNT FOR DIALYSIS; ADDITIONAL ACCESS; IR ULTRASOUND GUIDANCE VASC ACCESS RIGHT; IR RIGHT FLOURO GUIDE CV LINE  Date: 07/15/2014  PROCEDURE: 1. Ultrasound-guided antegrade puncture adjacent to the arterial anastomosis 2. Ultrasound-guided retrograde puncture 3. Catheterization of the right subclavian vein with central and pull-back venogram 4. 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 Pharmacomechanical thrombolysis/thrombectomy declot procedure 6. Attempted catheterization of the arterial inflow with limited arteriogram 7. Ultrasound-guided puncture of the right subclavian vein from a supraclavicular approach 8. Placement of a 19 cm tip to cuff tunneled hemodialysis catheter Interventional Radiologist:  Sterling Big, MD  ANESTHESIA/SEDATION:  Moderate (conscious) sedation was used. 2 mg Versed, 100 mcg Fentanyl were administered intravenously. The patient's vital signs were monitored continuously by radiology nursing throughout the procedure.  Sedation Time: 80 minutes  MEDICATIONS: 2 g Ancef and 3000 units heparin administered intravenously. Ancef was administered within 1 hours of skin incision.  FLUOROSCOPY TIME:  15 minutes 30 seconds  CONTRAST:  30mL OMNIPAQUE IOHEXOL 300 MG/ML  SOLN  TECHNIQUE: Informed consent was obtained from the patient following explanation of the procedure, risks, benefits and alternatives. The patient understands, agrees and consents for the procedure. All questions were addressed. A time out was performed.  Maximal barrier sterile technique utilized including caps, mask, sterile gowns, sterile gloves, large sterile drape, hand hygiene, and Betadine skin prep.  The right upper extremity arteriovenous fistula was interrogated with ultrasound. There is subocclusive clot throughout the main stick zone. The vein proximal and distal to this is patent and compressible.  However, there is a very weak pulse suggesting possible inflow disease versus high-grade central stenosis.  Local anesthesia was attained by infiltration with 1% lidocaine. Under real-time sonographic guidance, the vein was punctured in antegrade fashion several cm from the arterial anastomosis. A transitional 5 French micro sheath was advanced over a micro wire into the stick zone. 2 mg of tPA was then injected into the thrombus. This was manually macerated by gentle massage while the outflow vein was capped compressed with the ultrasound probe. 3000 units of heparin was injected intravenously.  The transitional micro sheath was then exchanged over a Bentson wire for a working 7 [place] vascular sheath. A Kumpe the catheter was advanced into the right subclavian vein and a central venogram was performed. There appears to be a moderate to high-grade stenosis of the  subclavian vein as it passes under the clavicle. A pull-back venogram was then performed demonstrating patency of the cephalic arch and proximal cephalic vein.  A Rosen wire was next advanced into the superior vena cava and the subclavian vein was angioplasty to 10 mm using a 10 x 40 mm Conquest balloon. The inflation was maintained at 20 atmospheres with full effacement for 2 minutes. Following angioplasty, the balloon was removed. The 6 French Angiojet device was then inserted over the [place] wire and rheolytic thrombectomy/thrombolysis was performed throughout the region of the fistula with internal thrombus. The Angiojet was then removed and a 7 x 40 mm Conquest balloon was advanced through the sheath an used to further balloon macerate and angioplasty the stick zone and proximal draining vein.  Following this, aspiration was performed through the sheath. There was blood flow however it was extremely week. Therefore, using similar technique to above, ultrasound-guided puncture of the draining vein in retrograde fashion directed toward the arterial anastomosis was performed with a 21 gauge micropuncture needle. An image was obtained and stored for the medical record. Using the transitional micro sheath, a working 6 [place] vascular sheath was advanced into the vein. A Kumpe the catheter and Glidewire were used to advanced into the arterial anastomosis. A gentle hand injection of contrast material demonstrates stasis within the inflow artery secondary to the 5 French catheter being occlusive. This was confirmed is the catheter was gently removed. Once back within the main draining vein the contrast column slowly washed out into the fistula. There is no distal flow which represents an interval change compared to October of 2014. Unfortunately, the inflow radial artery is extremely atretic. There is no outflow distal to the stenosis. The vessel measures approximately 2 mm in diameter.  Attempts were made to gently  catheterize the very small radial artery, however the 5 French catheter was occlusive and a wire could not make the 90 degree bend. Given the very poor arterial inflow, the chance of obtaining sustained patency of this fistula is near 0. Therefore, further intervention was aborted.  Attention was turned to the right neck for placement of a tunneled hemodialysis catheter.  The right neck was interrogated with ultrasound. The right internal jugular vein is atretic and thick walled and cannot be visualized directly to the subclavian vein. The subclavian vein and external jugular vein are slightly hyper trophic. 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 As the subclavian vein was easily visible from a supraclavicular approach, it was decided to place the catheter directly into this vessel. Local anesthesia was attained by infiltration with 1% lidocaine. A small dermatotomy was made. Under real-time sonographic guidance (an image was  obtained and stored for the medical record) the vessels punctured with a 21 gauge micropuncture needle. With the assistance of a 5 French micro sheath, a 0.018 inch wire was advanced into the heart in the intravascular depth measured. The micro wire was then removed and a 0.035 inch wire was advanced through the heart and into the inferior vena cava.  A suitable skin exit site inferior to the clavicle was selected. Local anesthesia was attained by 1% lidocaine infiltration. A small dermatotomy was made. A 19 cm tip to cuff Bard hemo split hemodialysis catheter was then tunneled from the skin exit site to the dermatotomy overlying the venous access site. A peel-away sheath was then advanced over the Bentson wire and into the right heart. The tunneled hemodialysis catheter was then fed through the peel-away sheath and the tips positioned in the upper right atrium under fluoroscopic imaging. An image was obtained and stored.  The catheter flushed and aspirated with ease and was locked  with heparinized saline. The catheter was then secured to the skin with 0 Prolene suture and a sterile bandage. The dermatotomy over the venous access site was sealed with Dermabond. The patient tolerated the procedure well.  COMPLICATIONS: None  IMPRESSION: 1. 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 2. Unsuccessful declot procedure secondary to very poor arterial inflow from a small, atherosclerotic and atretic radial artery. 3. Successful placement of a right IJ approach 19 cm tip to cuff tunneled hemodialysis catheter. Catheter tips are in the upper right atrium and ready for immediate use.  PLAN: 1. Recommend referral to vascular surgery for revision of the right upper extremity arteriovenous fistula. Signed,  Sterling Big, MD  [facility]  [facility]   Electronically Signed   By: Malachy Moan M.D.   On: 07/15/2014 17:16   Ir Declot Right Mod Sed  07/15/2014   CLINICAL DATA:  54 year old male with end-stage renal disease on hemodialysis. His right forearm  EXAM: IR RIGHT DECLOT; PTA VENOUS; ARTERIOVENOUS SHUNT FOR DIALYSIS; ADDITIONAL ACCESS; IR ULTRASOUND GUIDANCE VASC ACCESS RIGHT; IR RIGHT FLOURO GUIDE CV LINE  Date: 07/15/2014  PROCEDURE: 1. Ultrasound-guided antegrade puncture adjacent to the arterial anastomosis 2. Ultrasound-guided retrograde puncture 3. Catheterization of the right subclavian vein with central and pull-back venogram 4. 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 Pharmacomechanical thrombolysis/thrombectomy declot procedure 6. Attempted catheterization of the arterial inflow with limited arteriogram 7. Ultrasound-guided puncture of the right subclavian vein from a supraclavicular approach 8. Placement of a 19 cm tip to cuff tunneled hemodialysis catheter Interventional Radiologist:  Sterling Big, MD  ANESTHESIA/SEDATION: Moderate (conscious) sedation was used. 2 mg Versed, 100 mcg Fentanyl  were administered intravenously. The patient's vital signs were monitored continuously by radiology nursing throughout the procedure.  Sedation Time: 80 minutes  MEDICATIONS: 2 g Ancef and 3000 units heparin administered intravenously. Ancef was administered within 1 hours of skin incision.  FLUOROSCOPY TIME:  15 minutes 30 seconds  CONTRAST:  30mL OMNIPAQUE IOHEXOL 300 MG/ML  SOLN  TECHNIQUE: Informed consent was obtained from the patient following explanation of the procedure, risks, benefits and alternatives. The patient understands, agrees and consents for the procedure. All questions were addressed. A time out was performed.  Maximal barrier sterile technique utilized including caps, mask, sterile gowns, sterile gloves, large sterile drape, hand hygiene, and Betadine skin prep.  The right upper extremity arteriovenous fistula was interrogated with ultrasound. There is subocclusive clot throughout the main stick  zone. The vein proximal and distal to this is patent and compressible. However, there is a very weak pulse suggesting possible inflow disease versus high-grade central stenosis.  Local anesthesia was attained by infiltration with 1% lidocaine. Under real-time sonographic guidance, the vein was punctured in antegrade fashion several cm from the arterial anastomosis. A transitional 5 French micro sheath was advanced over a micro wire into the stick zone. 2 mg of tPA was then injected into the thrombus. This was manually macerated by gentle massage while the outflow vein was capped compressed with the ultrasound probe. 3000 units of heparin was injected intravenously.  The transitional micro sheath was then exchanged over a Bentson wire for a working 7 [place] vascular sheath. A Kumpe the catheter was advanced into the right subclavian vein and a central venogram was performed. 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 A pull-back venogram  was then performed demonstrating patency of the cephalic arch and proximal cephalic vein.  A Rosen wire was next advanced into the superior vena cava and the subclavian vein was angioplasty to 10 mm using a 10 x 40 mm Conquest balloon. The inflation was maintained at 20 atmospheres with full effacement for 2 minutes. Following angioplasty, the balloon was removed. The 6 French Angiojet device was then inserted over the Gilman wire and rheolytic thrombectomy/thrombolysis was performed throughout the region of the fistula with internal thrombus. The Angiojet was then removed and a 7 x 40 mm Conquest balloon was advanced through the sheath an used to further balloon macerate and angioplasty the stick zone and proximal draining vein.  Following this, aspiration was performed through the sheath. There was blood flow however it was extremely week. Therefore, using similar technique to above, ultrasound-guided puncture of the draining vein in retrograde fashion directed toward the arterial anastomosis was performed with a 21 gauge micropuncture needle. An image was obtained and stored for the medical record. Using the transitional micro sheath, a working 6 [place] vascular sheath was advanced into the vein. A Kumpe the catheter and Glidewire were used to advanced into the arterial anastomosis. A gentle hand injection of contrast material demonstrates stasis within the inflow artery secondary to the 5 French catheter being occlusive. This was confirmed is the catheter was gently removed. Once back within the main draining vein the contrast column slowly washed out into the fistula. There is no distal flow which represents an interval change compared to October of 2014. Unfortunately, the inflow radial artery is extremely atretic. There is no outflow distal to the stenosis. The vessel measures approximately 2 mm in diameter.  Attempts were made to gently catheterize the very small radial artery, however the 5 French catheter  was occlusive and a wire could not make the 90 degree bend. Given the very poor arterial inflow, the chance of obtaining sustained patency of this fistula is near 0. Therefore, further intervention was aborted.  Attention was turned to the right neck for placement of a tunneled hemodialysis catheter.  The right neck was interrogated with ultrasound. The right internal jugular vein is atretic and thick walled and cannot be visualized directly to the subclavian vein. The subclavian vein and external jugular vein are slightly hyper trophic. 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 As the subclavian vein was easily visible from a supraclavicular approach, it was decided to place the catheter directly into this vessel. Local anesthesia was attained by infiltration with 1% lidocaine.  A small dermatotomy was made. Under real-time sonographic guidance (an image was obtained and stored for the medical record) the vessels punctured with a 21 gauge micropuncture needle. With the assistance of a 5 French micro sheath, a 0.018 inch wire was advanced into the heart in the intravascular depth measured. The micro wire was then removed and a 0.035 inch wire was advanced through the heart and into the inferior vena cava.  A suitable skin exit site inferior to the clavicle was selected. Local anesthesia was attained by 1% lidocaine infiltration. A small dermatotomy was made. A 19 cm tip to cuff Bard hemo split hemodialysis catheter was then tunneled from the skin exit site to the dermatotomy overlying the venous access site. A peel-away sheath was then advanced over the Bentson wire and into the right heart. The tunneled hemodialysis catheter was then fed through the peel-away sheath and the tips positioned in the upper right atrium under fluoroscopic imaging. An image was obtained and stored.  The catheter flushed and aspirated with ease and was locked with heparinized saline. The catheter was then secured to the skin with 0  Prolene suture and a sterile bandage. The dermatotomy over the venous access site was sealed with Dermabond. The patient tolerated the procedure well.  COMPLICATIONS: None  IMPRESSION: 1. 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 2. Unsuccessful declot procedure secondary to very poor arterial inflow from a small, atherosclerotic and atretic radial artery. 3. Successful placement of a right IJ approach 19 cm tip to cuff tunneled hemodialysis catheter. Catheter tips are in the upper right atrium and ready for immediate use.  PLAN: 1. Recommend referral to vascular surgery for revision of the right upper extremity arteriovenous fistula. Signed,  Sterling Big, MD  [facility]  [facility]   Electronically Signed   By: Malachy Moan M.D.   On: 07/15/2014 17:16    Scheduled: . sodium chloride   Intravenous Once  . sodium chloride   Intravenous Once  . [MAR Hold] amLODipine  10 mg Oral Daily  .  ceFAZolin (ANCEF) IV  2 g Intravenous 60 min Pre-Op  . [MAR Hold] heparin subcutaneous  5,000 Units Subcutaneous 3 times per day  . HYDROmorphone      . [MAR Hold] insulin aspart  0-9 Units Subcutaneous 6 times per day  . [MAR Hold] lisinopril  20 mg Oral Daily  . mupirocin ointment      . [MAR Hold] pantoprazole  40 mg Oral Daily  . [MAR Hold] sodium chloride  3 mL Intravenous Q12H     LOS: 4 days   Xara Paulding C 07/17/2014,11:47 AM

## 2014-07-17 NOTE — Progress Notes (Signed)
OT Cancellation Note  Patient Details Name: Theodore AhmadiMiguel L Geisler MRN: [id number] DOB: 1960/06/18   Cancelled Treatment:    Reason Eval/Treat Not Completed: Patient at procedure or test/ unavailable. Pt off floor at HD. OT will follow up as available to complete evaluation.   [name], [name] M 07/17/2014, 3:26 PM   Carney LivingLeeAnn Marie Braelyn Jenson, OTR/L Occupational Therapist [phone] (pager)

## 2014-07-17 NOTE — Anesthesia Preprocedure Evaluation (Addendum)
Anesthesia Evaluation  Patient identified by MRN, date of birth, ID band Patient awake    Reviewed: Allergy & Precautions, NPO status , Patient's Chart, lab work & pertinent test results  History of Anesthesia Complications Negative for: history of anesthetic complications  Airway Mallampati: II  TM Distance: >3 FB Neck ROM: Full    Dental  (+) Teeth Intact, Dental Advisory Given   Pulmonary neg pulmonary ROS,  breath sounds clear to auscultation        Cardiovascular hypertension, Pt. on medications - anginaRhythm:Regular     Neuro/Psych SLIGHT LEFT SIDED WEAKNESS CVA, Residual Symptoms    GI/Hepatic negative GI ROS, Neg liver ROS,   Endo/Other  diabetes, Type 2  Renal/GU ESRF and DialysisRenal diseaseMWF HD     Musculoskeletal   Abdominal   Peds  Hematology  (+) anemia ,   Anesthesia Other Findings   Reproductive/Obstetrics                           Anesthesia Physical Anesthesia Plan  ASA: III  Anesthesia Plan: General   Post-op Pain Management:    Induction: Intravenous  Airway Management Planned: LMA  Additional Equipment: None  Intra-op Plan:   Post-operative Plan: Extubation in OR  Informed Consent: I have reviewed the patients History and Physical, chart, labs and discussed the procedure including the risks, benefits and alternatives for the proposed anesthesia with the patient or authorized representative who has indicated his/her understanding and acceptance.   Dental advisory given  Plan Discussed with: CRNA, Anesthesiologist and Surgeon  Anesthesia Plan Comments:        Anesthesia Quick Evaluation

## 2014-07-18 LAB — COMPREHENSIVE METABOLIC PANEL
ALBUMIN: 2.6 g/dL — AB (ref 3.5–5.2)
ALT: 53 U/L (ref 0–53)
ANION GAP: 12 (ref 5–15)
AST: 53 U/L — ABNORMAL HIGH (ref 0–37)
Alkaline Phosphatase: 203 U/L — ABNORMAL HIGH (ref 39–117)
BUN: 18 mg/dL (ref 6–23)
CO2: 26 mmol/L (ref 19–32)
Calcium: 8.3 mg/dL — ABNORMAL LOW (ref 8.4–10.5)
Chloride: 97 mmol/L (ref 96–112)
Creatinine, Ser: 5.75 mg/dL — ABNORMAL HIGH (ref 0.50–1.35)
GFR, EST AFRICAN AMERICAN: 12 mL/min — AB (ref >90)
GFR, EST NON AFRICAN AMERICAN: 10 mL/min — AB (ref >90)
GLUCOSE: 160 mg/dL — AB (ref 70–99)
POTASSIUM: 4 mmol/L (ref 3.5–5.1)
Sodium: 135 mmol/L (ref 135–145)
TOTAL PROTEIN: 5.9 g/dL — AB (ref 6.0–8.3)
Total Bilirubin: 0.4 mg/dL (ref 0.3–1.2)

## 2014-07-18 LAB — GLUCOSE, CAPILLARY
Glucose-Capillary: 140 mg/dL — ABNORMAL HIGH (ref 70–99)
Glucose-Capillary: 154 mg/dL — ABNORMAL HIGH (ref 70–99)
Glucose-Capillary: 164 mg/dL — ABNORMAL HIGH (ref 70–99)
Glucose-Capillary: 170 mg/dL — ABNORMAL HIGH (ref 70–99)

## 2014-07-18 LAB — CBC
HCT: 26.5 % — ABNORMAL LOW (ref 39.0–52.0)
Hemoglobin: 8.8 g/dL — ABNORMAL LOW (ref 13.0–17.0)
MCH: 28.9 pg (ref 26.0–34.0)
MCHC: 33.2 g/dL (ref 30.0–36.0)
MCV: 86.9 fL (ref 78.0–100.0)
PLATELETS: 163 10*3/uL (ref 150–400)
RBC: 3.05 MIL/uL — ABNORMAL LOW (ref 4.22–5.81)
RDW: 13.7 % (ref 11.5–15.5)
WBC: 7.1 10*3/uL (ref 4.0–10.5)

## 2014-07-18 MED ORDER — NEPRO/CARBSTEADY PO LIQD: 237.0000 mL | ORAL | Status: AC | PRN

## 2014-07-18 MED ORDER — PANTOPRAZOLE SODIUM 40 MG PO TBEC: 40.0000 mg | DELAYED_RELEASE_TABLET | Freq: Every day | ORAL | Status: AC

## 2014-07-18 MED ORDER — OXYCODONE-ACETAMINOPHEN 5-325 MG PO TABS: 1.0000 | ORAL_TABLET | Freq: Four times a day (QID) | ORAL | Status: AC | PRN

## 2014-07-18 NOTE — Progress Notes (Signed)
I had a discussion with him about lim,ited accesses and need to consider kidney transplantaion. Unfortunately, he has emergency medicaid and I think this will prohibit this.  He can discuss this further with Dr. Fausto SkillernBefakadu. Theodore Hurley

## 2014-07-18 NOTE — Discharge Summary (Addendum)
Physician Discharge Summary  Theodore Hurley MRN: [id number] DOB/AGE: [birth year] 53 y.o.  PCP: Bobby Rumpf, MD   Admit date: 07/13/2014 Discharge date: 07/18/2014  Discharge Diagnoses:  Severe metabolic acidosis INSERTION OF ARTERIOVENOUS (AV) GORE-TEX GRAFT (88mx40cm) LEFT THIGH    DM2 (diabetes mellitus, type 2)   Essential hypertension   End stage renal disease   Uremia   Hypotension (arterial)   Dyslipidemia   Anemia of renal disease   Clotted dialysis access   Dialysis AV fistula malfunction   Arterial hypotension  Follow-up recommendations Follow-up with PCP in 5-7 days Labs with hemodialysis    Medication List    TAKE these medications        acetaminophen 500 MG tablet  Commonly known as:  TYLENOL  Take 500 mg by mouth every 6 (six) hours as needed.     amLODipine 10 MG tablet  Commonly known as:  NORVASC  Take 10 mg by mouth daily.     aspirin EC 81 MG tablet  Take 81 mg by mouth daily.     Capsaicin 0.035 % Crea  Apply 1 application topically 3 (three) times daily as needed (for sore areas).     cinacalcet 90 MG tablet  Commonly known as:  SENSIPAR  Take 90 mg by mouth daily.     feeding supplement (NEPRO CARB STEADY) Liqd  Take 237 mLs by mouth as needed (missed meal during dialysis.).     glipiZIDE 10 MG tablet  Commonly known as:  GLUCOTROL  Take 10 mg by mouth daily before breakfast.     lanthanum 1000 MG chewable tablet  Commonly known as:  FOSRENOL  Chew 1,000-2,000 mg by mouth as directed. 20036mwith meals, 100030mith snacks     lisinopril 20 MG tablet  Commonly known as:  PRINIVIL,ZESTRIL  Take 20 mg by mouth daily.     oxyCODONE-acetaminophen 5-325 MG per tablet  Commonly known as:  PERCOCET/ROXICET  Take 1 tablet by mouth every 6 (six) hours as needed for moderate pain.     pantoprazole 40 MG tablet  Commonly known as:  PROTONIX  Take 1 tablet (40 mg total) by mouth daily.     simvastatin 10 MG tablet  Commonly  known as:  ZOCOR  Take 10 mg by mouth daily.        Discharge Condition: Stable Disposition:    Consults: Vascular Nephrology Critical care  Significant Diagnostic Studies: Dg Chest 2 View  07/13/2014   CLINICAL DATA:  Weakness and hypotension for 2 days  EXAM: CHEST  2 VIEW  COMPARISON:  July 13, 2014 study obtained earlier in the day  FINDINGS: There is no edema or consolidation. Heart is mildly enlarged with pulmonary vascularity within normal limits. No adenopathy. There is postoperative change in the lower cervical spine.  IMPRESSION: Mild cardiac enlargement.  No edema or consolidation.   Electronically Signed   By: WilLowella GripI M.D.   On: 07/13/2014 16:19   Ir Pta Venous Right  07/15/2014   CLINICAL DATA:  54 1ar old male with end-stage renal disease on hemodialysis. His right forearm  EXAM: IR RIGHT DECLOT; PTA VENOUS; ARTERIOVENOUS SHUNT FOR DIALYSIS; ADDITIONAL ACCESS; IR ULTRASOUND GUIDANCE VASC ACCESS RIGHT; IR RIGHT FLOURO GUIDE CV LINE  Date: 07/15/2014  PROCEDURE: 1. Ultrasound-guided antegrade puncture adjacent to the arterial anastomosis 2. Ultrasound-guided retrograde puncture 3. Catheterization of the right subclavian vein with central and pull-back venogram 4. 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 Pharmacomechanical  thrombolysis/thrombectomy declot procedure 6. Attempted catheterization of the arterial inflow with limited arteriogram 7. Ultrasound-guided puncture of the right subclavian vein from a supraclavicular approach 8. Placement of a 19 cm tip to cuff tunneled hemodialysis catheter Interventional Radiologist:  Criselda Peaches, MD  ANESTHESIA/SEDATION: Moderate (conscious) sedation was used. 2 mg Versed, 100 mcg Fentanyl were administered intravenously. The patient's vital signs were monitored continuously by radiology nursing throughout the procedure.  Sedation Time: 80 minutes  MEDICATIONS: 2 g Ancef and 3000 units heparin administered  intravenously. Ancef was administered within 1 hours of skin incision.  FLUOROSCOPY TIME:  15 minutes 30 seconds  CONTRAST:  51m OMNIPAQUE IOHEXOL 300 MG/ML  SOLN  TECHNIQUE: Informed consent was obtained from the patient following explanation of the procedure, risks, benefits and alternatives. The patient understands, agrees and consents for the procedure. All questions were addressed. A time out was performed.  Maximal barrier sterile technique utilized including caps, mask, sterile gowns, sterile gloves, large sterile drape, hand hygiene, and Betadine skin prep.  The right upper extremity arteriovenous fistula was interrogated with ultrasound. There is subocclusive clot throughout the main stick zone. The vein proximal and distal to this is patent and compressible. However, there is a very weak pulse suggesting possible inflow disease versus high-grade central stenosis.  Local anesthesia was attained by infiltration with 1% lidocaine. Under real-time sonographic guidance, the vein was punctured in antegrade fashion several cm from the arterial anastomosis. A transitional 5 French micro sheath was advanced over a micro wire into the stick zone. 2 mg of tPA was then injected into the thrombus. This was manually macerated by gentle massage while the outflow vein was capped compressed with the ultrasound probe. 3000 units of heparin was injected intravenously.  The transitional micro sheath was then exchanged over a Bentson wire for a working 7 [place] sheath. A Kumpe the catheter was advanced into the right subclavian vein and a central venogram was performed. 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 A pull-back venogram was then performed demonstrating patency of the cephalic arch and proximal cephalic vein.  A Rosen wire was next advanced into the superior vena cava and the subclavian vein was angioplasty to 10 mm using a 10 x 40 mm  Conquest balloon. The inflation was maintained at 20 atmospheres with full effacement for 2 minutes. Following angioplasty, the balloon was removed. The 6 French Angiojet device was then inserted over the [place]wire and rheolytic thrombectomy/thrombolysis was performed throughout the region of the fistula with internal thrombus. The Angiojet was then removed and a 7 x 40 mm Conquest balloon was advanced through the sheath an used to further balloon macerate and angioplasty the stick zone and proximal draining vein.  Following this, aspiration was performed through the sheath. There was blood flow however it was extremely week. Therefore, using similar technique to above, ultrasound-guided puncture of the draining vein in retrograde fashion directed toward the arterial anastomosis was performed with a 21 gauge micropuncture needle. An image was obtained and stored for the medical record. Using the transitional micro sheath, a working 6 [place] sheath was advanced into the vein. A Kumpe the catheter and Glidewire were used to advanced into the arterial anastomosis. A gentle hand injection of contrast material demonstrates stasis within the inflow artery secondary to the 5 French catheter being occlusive. This was confirmed is the catheter was gently removed. Once back within the main draining  vein the contrast column slowly washed out into the fistula. There is no distal flow which represents an interval change compared to October of 2014. Unfortunately, the inflow radial artery is extremely atretic. There is no outflow distal to the stenosis. The vessel measures approximately 2 mm in diameter.  Attempts were made to gently catheterize the very small radial artery, however the 5 French catheter was occlusive and a wire could not make the 90 degree bend. Given the very poor arterial inflow, the chance of obtaining sustained patency of this fistula is near 0. Therefore, further intervention was aborted.   Attention was turned to the right neck for placement of a tunneled hemodialysis catheter.  The right neck was interrogated with ultrasound. The right internal jugular vein is atretic and thick walled and cannot be visualized directly to the subclavian vein. The subclavian vein and external jugular vein are slightly hyper trophic. 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 As the subclavian vein was easily visible from a supraclavicular approach, it was decided to place the catheter directly into this vessel. Local anesthesia was attained by infiltration with 1% lidocaine. A small dermatotomy was made. Under real-time sonographic guidance (an image was obtained and stored for the medical record) the vessels punctured with a 21 gauge micropuncture needle. With the assistance of a 5 French micro sheath, a 0.018 inch wire was advanced into the heart in the intravascular depth measured. The micro wire was then removed and a 0.035 inch wire was advanced through the heart and into the inferior vena cava.  A suitable skin exit site inferior to the clavicle was selected. Local anesthesia was attained by 1% lidocaine infiltration. A small dermatotomy was made. A 19 cm tip to cuff Bard hemo split hemodialysis catheter was then tunneled from the skin exit site to the dermatotomy overlying the venous access site. A peel-away sheath was then advanced over the Bentson wire and into the right heart. The tunneled hemodialysis catheter was then fed through the peel-away sheath and the tips positioned in the upper right atrium under fluoroscopic imaging. An image was obtained and stored.  The catheter flushed and aspirated with ease and was locked with heparinized saline. The catheter was then secured to the skin with 0 Prolene suture and a sterile bandage. The dermatotomy over the venous access site was sealed with Dermabond. The patient tolerated the procedure well.  COMPLICATIONS: None  IMPRESSION: 1. Angioplasty of right  subclavian venous stenosis to 10 mm. 2. Unsuccessful declot procedure secondary to very poor arterial inflow from a small, atherosclerotic and atretic radial artery. 3. Successful placement of a right IJ approach 19 cm tip to cuff tunneled hemodialysis catheter. Catheter tips are in the upper right atrium and ready for immediate use.  PLAN: 1. Recommend referral to vascular surgery for revision of the right upper extremity arteriovenous fistula. Signed,  Criselda Peaches, MD  [facility]  [facility]   Electronically Signed   By: Jacqulynn Cadet M.D.   On: 07/15/2014 17:16   Ir Pta Venous Right  07/15/2014   CLINICAL DATA:  54 year old male with end-stage renal disease on hemodialysis. His right forearm  EXAM: IR RIGHT DECLOT; PTA VENOUS; ARTERIOVENOUS SHUNT FOR DIALYSIS; ADDITIONAL ACCESS; IR ULTRASOUND GUIDANCE VASC ACCESS RIGHT; IR RIGHT FLOURO GUIDE CV LINE  Date: 07/15/2014  PROCEDURE: 1. Ultrasound-guided antegrade puncture adjacent to the arterial anastomosis 2. Ultrasound-guided retrograde puncture 3. Catheterization of the right subclavian vein with central and pull-back venogram  4. 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 Pharmacomechanical thrombolysis/thrombectomy declot procedure 6. Attempted catheterization of the arterial inflow with limited arteriogram 7. Ultrasound-guided puncture of the right subclavian vein from a supraclavicular approach 8. Placement of a 19 cm tip to cuff tunneled hemodialysis catheter Interventional Radiologist:  Criselda Peaches, MD  ANESTHESIA/SEDATION: Moderate (conscious) sedation was used. 2 mg Versed, 100 mcg Fentanyl were administered intravenously. The patient's vital signs were monitored continuously by radiology nursing throughout the procedure.  Sedation Time: 80 minutes  MEDICATIONS: 2 g Ancef and 3000 units heparin administered intravenously. Ancef was administered within 1 hours of skin  incision.  FLUOROSCOPY TIME:  15 minutes 30 seconds  CONTRAST:  77m OMNIPAQUE IOHEXOL 300 MG/ML  SOLN  TECHNIQUE: Informed consent was obtained from the patient following explanation of the procedure, risks, benefits and alternatives. The patient understands, agrees and consents for the procedure. All questions were addressed. A time out was performed.  Maximal barrier sterile technique utilized including caps, mask, sterile gowns, sterile gloves, large sterile drape, hand hygiene, and Betadine skin prep.  The right upper extremity arteriovenous fistula was interrogated with ultrasound. There is subocclusive clot throughout the main stick zone. The vein proximal and distal to this is patent and compressible. However, there is a very weak pulse suggesting possible inflow disease versus high-grade central stenosis.  Local anesthesia was attained by infiltration with 1% lidocaine. Under real-time sonographic guidance, the vein was punctured in antegrade fashion several cm from the arterial anastomosis. A transitional 5 French micro sheath was advanced over a micro wire into the stick zone. 2 mg of tPA was then injected into the thrombus. This was manually macerated by gentle massage while the outflow vein was capped compressed with the ultrasound probe. 3000 units of heparin was injected intravenously.  The transitional micro sheath was then exchanged over a Bentson wire for a working 7 [place] sheath. A Kumpe the catheter was advanced into the right subclavian vein and a central venogram was performed. 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 A pull-back venogram was then performed demonstrating patency of the cephalic arch and proximal cephalic vein.  A Rosen wire was next advanced into the superior vena cava and the subclavian vein was angioplasty to 10 mm using a 10 x 40 mm Conquest balloon. The inflation was maintained at 20 atmospheres  with full effacement for 2 minutes. Following angioplasty, the balloon was removed. The 6 French Angiojet device was then inserted over the [place]wire and rheolytic thrombectomy/thrombolysis was performed throughout the region of the fistula with internal thrombus. The Angiojet was then removed and a 7 x 40 mm Conquest balloon was advanced through the sheath an used to further balloon macerate and angioplasty the stick zone and proximal draining vein.  Following this, aspiration was performed through the sheath. There was blood flow however it was extremely week. Therefore, using similar technique to above, ultrasound-guided puncture of the draining vein in retrograde fashion directed toward the arterial anastomosis was performed with a 21 gauge micropuncture needle. An image was obtained and stored for the medical record. Using the transitional micro sheath, a working 6 [place] sheath was advanced into the vein. A Kumpe the catheter and Glidewire were used to advanced into the arterial anastomosis. A gentle hand injection of contrast material demonstrates stasis within the inflow artery secondary to the 5 French catheter being occlusive. This was confirmed is  the catheter was gently removed. Once back within the main draining vein the contrast column slowly washed out into the fistula. There is no distal flow which represents an interval change compared to October of 2014. Unfortunately, the inflow radial artery is extremely atretic. There is no outflow distal to the stenosis. The vessel measures approximately 2 mm in diameter.  Attempts were made to gently catheterize the very small radial artery, however the 5 French catheter was occlusive and a wire could not make the 90 degree bend. Given the very poor arterial inflow, the chance of obtaining sustained patency of this fistula is near 0. Therefore, further intervention was aborted.  Attention was turned to the right neck for placement of a tunneled  hemodialysis catheter.  The right neck was interrogated with ultrasound. The right internal jugular vein is atretic and thick walled and cannot be visualized directly to the subclavian vein. The subclavian vein and external jugular vein are slightly hyper trophic. 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 As the subclavian vein was easily visible from a supraclavicular approach, it was decided to place the catheter directly into this vessel. Local anesthesia was attained by infiltration with 1% lidocaine. A small dermatotomy was made. Under real-time sonographic guidance (an image was obtained and stored for the medical record) the vessels punctured with a 21 gauge micropuncture needle. With the assistance of a 5 French micro sheath, a 0.018 inch wire was advanced into the heart in the intravascular depth measured. The micro wire was then removed and a 0.035 inch wire was advanced through the heart and into the inferior vena cava.  A suitable skin exit site inferior to the clavicle was selected. Local anesthesia was attained by 1% lidocaine infiltration. A small dermatotomy was made. A 19 cm tip to cuff Bard hemo split hemodialysis catheter was then tunneled from the skin exit site to the dermatotomy overlying the venous access site. A peel-away sheath was then advanced over the Bentson wire and into the right heart. The tunneled hemodialysis catheter was then fed through the peel-away sheath and the tips positioned in the upper right atrium under fluoroscopic imaging. An image was obtained and stored.  The catheter flushed and aspirated with ease and was locked with heparinized saline. The catheter was then secured to the skin with 0 Prolene suture and a sterile bandage. The dermatotomy over the venous access site was sealed with Dermabond. The patient tolerated the procedure well.  COMPLICATIONS: None  IMPRESSION: 1. 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 2. Unsuccessful declot procedure  secondary to very poor arterial inflow from a small, atherosclerotic and atretic radial artery. 3. Successful placement of a right IJ approach 19 cm tip to cuff tunneled hemodialysis catheter. Catheter tips are in the upper right atrium and ready for immediate use.  PLAN: 1. Recommend referral to vascular surgery for revision of the right upper extremity arteriovenous fistula. Signed,  Criselda Peaches, MD  [facility]  [facility]   Electronically Signed   By: Jacqulynn Cadet M.D.   On: 07/15/2014 17:16   Ir Fluoro Guide Cv Line Right  07/15/2014   CLINICAL DATA:  53 year old male with end-stage renal disease on hemodialysis. His right forearm  EXAM: IR RIGHT DECLOT; PTA VENOUS; ARTERIOVENOUS SHUNT FOR DIALYSIS; ADDITIONAL ACCESS; IR ULTRASOUND GUIDANCE VASC ACCESS RIGHT; IR RIGHT FLOURO GUIDE CV LINE  Date: 07/15/2014  PROCEDURE: 1. Ultrasound-guided antegrade puncture adjacent to the arterial anastomosis 2. Ultrasound-guided retrograde  puncture 3. Catheterization of the right subclavian vein with central and pull-back venogram 4. 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 Pharmacomechanical thrombolysis/thrombectomy declot procedure 6. Attempted catheterization of the arterial inflow with limited arteriogram 7. Ultrasound-guided puncture of the right subclavian vein from a supraclavicular approach 8. Placement of a 19 cm tip to cuff tunneled hemodialysis catheter Interventional Radiologist:  Criselda Peaches, MD  ANESTHESIA/SEDATION: Moderate (conscious) sedation was used. 2 mg Versed, 100 mcg Fentanyl were administered intravenously. The patient's vital signs were monitored continuously by radiology nursing throughout the procedure.  Sedation Time: 80 minutes  MEDICATIONS: 2 g Ancef and 3000 units heparin administered intravenously. Ancef was administered within 1 hours of skin incision.  FLUOROSCOPY TIME:  15 minutes 30 seconds  CONTRAST:  19m  OMNIPAQUE IOHEXOL 300 MG/ML  SOLN  TECHNIQUE: Informed consent was obtained from the patient following explanation of the procedure, risks, benefits and alternatives. The patient understands, agrees and consents for the procedure. All questions were addressed. A time out was performed.  Maximal barrier sterile technique utilized including caps, mask, sterile gowns, sterile gloves, large sterile drape, hand hygiene, and Betadine skin prep.  The right upper extremity arteriovenous fistula was interrogated with ultrasound. There is subocclusive clot throughout the main stick zone. The vein proximal and distal to this is patent and compressible. However, there is a very weak pulse suggesting possible inflow disease versus high-grade central stenosis.  Local anesthesia was attained by infiltration with 1% lidocaine. Under real-time sonographic guidance, the vein was punctured in antegrade fashion several cm from the arterial anastomosis. A transitional 5 French micro sheath was advanced over a micro wire into the stick zone. 2 mg of tPA was then injected into the thrombus. This was manually macerated by gentle massage while the outflow vein was capped compressed with the ultrasound probe. 3000 units of heparin was injected intravenously.  The transitional micro sheath was then exchanged over a Bentson wire for a working 7 [place] sheath. A Kumpe the catheter was advanced into the right subclavian vein and a central venogram was performed. 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 A pull-back venogram was then performed demonstrating patency of the cephalic arch and proximal cephalic vein.  A Rosen wire was next advanced into the superior vena cava and the subclavian vein was angioplasty to 10 mm using a 10 x 40 mm Conquest balloon. The inflation was maintained at 20 atmospheres with full effacement for 2 minutes. Following angioplasty, the  balloon was removed. The 6 French Angiojet device was then inserted over the [place]wire and rheolytic thrombectomy/thrombolysis was performed throughout the region of the fistula with internal thrombus. The Angiojet was then removed and a 7 x 40 mm Conquest balloon was advanced through the sheath an used to further balloon macerate and angioplasty the stick zone and proximal draining vein.  Following this, aspiration was performed through the sheath. There was blood flow however it was extremely week. Therefore, using similar technique to above, ultrasound-guided puncture of the draining vein in retrograde fashion directed toward the arterial anastomosis was performed with a 21 gauge micropuncture needle. An image was obtained and stored for the medical record. Using the transitional micro sheath, a working 6 [place] sheath was advanced into the vein. A Kumpe the catheter and Glidewire were used to advanced into the arterial anastomosis. A gentle hand injection of contrast material demonstrates stasis within the inflow  artery secondary to the 5 French catheter being occlusive. This was confirmed is the catheter was gently removed. Once back within the main draining vein the contrast column slowly washed out into the fistula. There is no distal flow which represents an interval change compared to October of 2014. Unfortunately, the inflow radial artery is extremely atretic. There is no outflow distal to the stenosis. The vessel measures approximately 2 mm in diameter.  Attempts were made to gently catheterize the very small radial artery, however the 5 French catheter was occlusive and a wire could not make the 90 degree bend. Given the very poor arterial inflow, the chance of obtaining sustained patency of this fistula is near 0. Therefore, further intervention was aborted.  Attention was turned to the right neck for placement of a tunneled hemodialysis catheter.  The right neck was interrogated with  ultrasound. The right internal jugular vein is atretic and thick walled and cannot be visualized directly to the subclavian vein. The subclavian vein and external jugular vein are slightly hyper trophic. 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 As the subclavian vein was easily visible from a supraclavicular approach, it was decided to place the catheter directly into this vessel. Local anesthesia was attained by infiltration with 1% lidocaine. A small dermatotomy was made. Under real-time sonographic guidance (an image was obtained and stored for the medical record) the vessels punctured with a 21 gauge micropuncture needle. With the assistance of a 5 French micro sheath, a 0.018 inch wire was advanced into the heart in the intravascular depth measured. The micro wire was then removed and a 0.035 inch wire was advanced through the heart and into the inferior vena cava.  A suitable skin exit site inferior to the clavicle was selected. Local anesthesia was attained by 1% lidocaine infiltration. A small dermatotomy was made. A 19 cm tip to cuff Bard hemo split hemodialysis catheter was then tunneled from the skin exit site to the dermatotomy overlying the venous access site. A peel-away sheath was then advanced over the Bentson wire and into the right heart. The tunneled hemodialysis catheter was then fed through the peel-away sheath and the tips positioned in the upper right atrium under fluoroscopic imaging. An image was obtained and stored.  The catheter flushed and aspirated with ease and was locked with heparinized saline. The catheter was then secured to the skin with 0 Prolene suture and a sterile bandage. The dermatotomy over the venous access site was sealed with Dermabond. The patient tolerated the procedure well.  COMPLICATIONS: None  IMPRESSION: 1. 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 2. Unsuccessful declot procedure secondary to very poor arterial inflow from a small,  atherosclerotic and atretic radial artery. 3. Successful placement of a right IJ approach 19 cm tip to cuff tunneled hemodialysis catheter. Catheter tips are in the upper right atrium and ready for immediate use.  PLAN: 1. Recommend referral to vascular surgery for revision of the right upper extremity arteriovenous fistula. Signed,  Criselda Peaches, MD  [facility]  [facility]   Electronically Signed   By: Jacqulynn Cadet M.D.   On: 07/15/2014 17:16   Ir Angio Av Shunt Addl Access  07/15/2014   CLINICAL DATA:  54 year old male with end-stage renal disease on hemodialysis. His right forearm  EXAM: IR RIGHT DECLOT; PTA VENOUS; ARTERIOVENOUS SHUNT FOR DIALYSIS; ADDITIONAL ACCESS; IR ULTRASOUND GUIDANCE VASC ACCESS RIGHT; IR RIGHT FLOURO GUIDE CV LINE  Date: 07/15/2014  PROCEDURE: 1. Ultrasound-guided antegrade puncture adjacent to the arterial anastomosis 2. Ultrasound-guided retrograde puncture 3. Catheterization of the right subclavian vein with central and pull-back venogram 4. 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 Pharmacomechanical thrombolysis/thrombectomy declot procedure 6. Attempted catheterization of the arterial inflow with limited arteriogram 7. Ultrasound-guided puncture of the right subclavian vein from a supraclavicular approach 8. Placement of a 19 cm tip to cuff tunneled hemodialysis catheter Interventional Radiologist:  Criselda Peaches, MD  ANESTHESIA/SEDATION: Moderate (conscious) sedation was used. 2 mg Versed, 100 mcg Fentanyl were administered intravenously. The patient's vital signs were monitored continuously by radiology nursing throughout the procedure.  Sedation Time: 80 minutes  MEDICATIONS: 2 g Ancef and 3000 units heparin administered intravenously. Ancef was administered within 1 hours of skin incision.  FLUOROSCOPY TIME:  15 minutes 30 seconds  CONTRAST:  57m OMNIPAQUE IOHEXOL 300 MG/ML  SOLN  TECHNIQUE:  Informed consent was obtained from the patient following explanation of the procedure, risks, benefits and alternatives. The patient understands, agrees and consents for the procedure. All questions were addressed. A time out was performed.  Maximal barrier sterile technique utilized including caps, mask, sterile gowns, sterile gloves, large sterile drape, hand hygiene, and Betadine skin prep.  The right upper extremity arteriovenous fistula was interrogated with ultrasound. There is subocclusive clot throughout the main stick zone. The vein proximal and distal to this is patent and compressible. However, there is a very weak pulse suggesting possible inflow disease versus high-grade central stenosis.  Local anesthesia was attained by infiltration with 1% lidocaine. Under real-time sonographic guidance, the vein was punctured in antegrade fashion several cm from the arterial anastomosis. A transitional 5 French micro sheath was advanced over a micro wire into the stick zone. 2 mg of tPA was then injected into the thrombus. This was manually macerated by gentle massage while the outflow vein was capped compressed with the ultrasound probe. 3000 units of heparin was injected intravenously.  The transitional micro sheath was then exchanged over a Bentson wire for a working 7 [place] sheath. A Kumpe the catheter was advanced into the right subclavian vein and a central venogram was performed. 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 A pull-back venogram was then performed demonstrating patency of the cephalic arch and proximal cephalic vein.  A Rosen wire was next advanced into the superior vena cava and the subclavian vein was angioplasty to 10 mm using a 10 x 40 mm Conquest balloon. The inflation was maintained at 20 atmospheres with full effacement for 2 minutes. Following angioplasty, the balloon was removed. The 6 French Angiojet device  was then inserted over the [place] and rheolytic thrombectomy/thrombolysis was performed throughout the region of the fistula with internal thrombus. The Angiojet was then removed and a 7 x 40 mm Conquest balloon was advanced through the sheath an used to further balloon macerate and angioplasty the stick zone and proximal draining vein.  Following this, aspiration was performed through the sheath. There was blood flow however it was extremely week. Therefore, using similar technique to above, ultrasound-guided puncture of the draining vein in retrograde fashion directed toward the arterial anastomosis was performed with a 21 gauge micropuncture needle. An image was obtained and stored for the medical record. Using the transitional micro sheath, a working 6 [place] sheath was advanced into the vein. A Kumpe the catheter and Glidewire were used to advanced into the arterial  anastomosis. A gentle hand injection of contrast material demonstrates stasis within the inflow artery secondary to the 5 French catheter being occlusive. This was confirmed is the catheter was gently removed. Once back within the main draining vein the contrast column slowly washed out into the fistula. There is no distal flow which represents an interval change compared to October of 2014. Unfortunately, the inflow radial artery is extremely atretic. There is no outflow distal to the stenosis. The vessel measures approximately 2 mm in diameter.  Attempts were made to gently catheterize the very small radial artery, however the 5 French catheter was occlusive and a wire could not make the 90 degree bend. Given the very poor arterial inflow, the chance of obtaining sustained patency of this fistula is near 0. Therefore, further intervention was aborted.  Attention was turned to the right neck for placement of a tunneled hemodialysis catheter.  The right neck was interrogated with ultrasound. The right internal jugular vein is atretic  and thick walled and cannot be visualized directly to the subclavian vein. The subclavian vein and external jugular vein are slightly hyper trophic. 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 As the subclavian vein was easily visible from a supraclavicular approach, it was decided to place the catheter directly into this vessel. Local anesthesia was attained by infiltration with 1% lidocaine. A small dermatotomy was made. Under real-time sonographic guidance (an image was obtained and stored for the medical record) the vessels punctured with a 21 gauge micropuncture needle. With the assistance of a 5 French micro sheath, a 0.018 inch wire was advanced into the heart in the intravascular depth measured. The micro wire was then removed and a 0.035 inch wire was advanced through the heart and into the inferior vena cava.  A suitable skin exit site inferior to the clavicle was selected. Local anesthesia was attained by 1% lidocaine infiltration. A small dermatotomy was made. A 19 cm tip to cuff Bard hemo split hemodialysis catheter was then tunneled from the skin exit site to the dermatotomy overlying the venous access site. A peel-away sheath was then advanced over the Bentson wire and into the right heart. The tunneled hemodialysis catheter was then fed through the peel-away sheath and the tips positioned in the upper right atrium under fluoroscopic imaging. An image was obtained and stored.  The catheter flushed and aspirated with ease and was locked with heparinized saline. The catheter was then secured to the skin with 0 Prolene suture and a sterile bandage. The dermatotomy over the venous access site was sealed with Dermabond. The patient tolerated the procedure well.  COMPLICATIONS: None  IMPRESSION: 1. 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 2. Unsuccessful declot procedure secondary to very poor arterial inflow from a small, atherosclerotic and atretic radial artery. 3. Successful  placement of a right IJ approach 19 cm tip to cuff tunneled hemodialysis catheter. Catheter tips are in the upper right atrium and ready for immediate use.  PLAN: 1. Recommend referral to vascular surgery for revision of the right upper extremity arteriovenous fistula. Signed,  Criselda Peaches, MD  [facility]  [facility]iology   Electronically Signed   By: Jacqulynn Cadet M.D.   On: 07/15/2014 17:16   Ir US Guide Vasc Access Right  07/15/2014   CLINICAL DATA:  54 year old male with end-stage renal disease on hemodialysis. His right forearm  EXAM: IR RIGHT DECLOT; PTA VENOUS; ARTERIOVENOUS SHUNT FOR DIALYSIS; ADDITIONAL ACCESS; IR ULTRASOUND GUIDANCE  VASC ACCESS RIGHT; IR RIGHT FLOURO GUIDE CV LINE  Date: 07/15/2014  PROCEDURE: 1. Ultrasound-guided antegrade puncture adjacent to the arterial anastomosis 2. Ultrasound-guided retrograde puncture 3. Catheterization of the right subclavian vein with central and pull-back venogram 4. 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 Pharmacomechanical thrombolysis/thrombectomy declot procedure 6. Attempted catheterization of the arterial inflow with limited arteriogram 7. Ultrasound-guided puncture of the right subclavian vein from a supraclavicular approach 8. Placement of a 19 cm tip to cuff tunneled hemodialysis catheter Interventional Radiologist:  Criselda Peaches, MD  ANESTHESIA/SEDATION: Moderate (conscious) sedation was used. 2 mg Versed, 100 mcg Fentanyl were administered intravenously. The patient's vital signs were monitored continuously by radiology nursing throughout the procedure.  Sedation Time: 80 minutes  MEDICATIONS: 2 g Ancef and 3000 units heparin administered intravenously. Ancef was administered within 1 hours of skin incision.  FLUOROSCOPY TIME:  15 minutes 30 seconds  CONTRAST:  57m OMNIPAQUE IOHEXOL 300 MG/ML  SOLN  TECHNIQUE: Informed consent was obtained from the patient following  explanation of the procedure, risks, benefits and alternatives. The patient understands, agrees and consents for the procedure. All questions were addressed. A time out was performed.  Maximal barrier sterile technique utilized including caps, mask, sterile gowns, sterile gloves, large sterile drape, hand hygiene, and Betadine skin prep.  The right upper extremity arteriovenous fistula was interrogated with ultrasound. There is subocclusive clot throughout the main stick zone. The vein proximal and distal to this is patent and compressible. However, there is a very weak pulse suggesting possible inflow disease versus high-grade central stenosis.  Local anesthesia was attained by infiltration with 1% lidocaine. Under real-time sonographic guidance, the vein was punctured in antegrade fashion several cm from the arterial anastomosis. A transitional 5 French micro sheath was advanced over a micro wire into the stick zone. 2 mg of tPA was then injected into the thrombus. This was manually macerated by gentle massage while the outflow vein was capped compressed with the ultrasound probe. 3000 units of heparin was injected intravenously.  The transitional micro sheath was then exchanged over a Bentson wire for a working 7 [place] sheath. A Kumpe the catheter was advanced into the right subclavian vein and a central venogram was performed. 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 A pull-back venogram was then performed demonstrating patency of the cephalic arch and proximal cephalic vein.  A Rosen wire was next advanced into the superior vena cava and the subclavian vein was angioplasty to 10 mm using a 10 x 40 mm Conquest balloon. The inflation was maintained at 20 atmospheres with full effacement for 2 minutes. Following angioplasty, the balloon was removed. The 6 French Angiojet device was then inserted over the [place] and rheolytic  thrombectomy/thrombolysis was performed throughout the region of the fistula with internal thrombus. The Angiojet was then removed and a 7 x 40 mm Conquest balloon was advanced through the sheath an used to further balloon macerate and angioplasty the stick zone and proximal draining vein.  Following this, aspiration was performed through the sheath. There was blood flow however it was extremely week. Therefore, using similar technique to above, ultrasound-guided puncture of the draining vein in retrograde fashion directed toward the arterial anastomosis was performed with a 21 gauge micropuncture needle. An image was obtained and stored for the medical record. Using the transitional micro sheath, a working 6 [place] sheath was advanced into the vein.  A Kumpe the catheter and Glidewire were used to advanced into the arterial anastomosis. A gentle hand injection of contrast material demonstrates stasis within the inflow artery secondary to the 5 French catheter being occlusive. This was confirmed is the catheter was gently removed. Once back within the main draining vein the contrast column slowly washed out into the fistula. There is no distal flow which represents an interval change compared to October of 2014. Unfortunately, the inflow radial artery is extremely atretic. There is no outflow distal to the stenosis. The vessel measures approximately 2 mm in diameter.  Attempts were made to gently catheterize the very small radial artery, however the 5 French catheter was occlusive and a wire could not make the 90 degree bend. Given the very poor arterial inflow, the chance of obtaining sustained patency of this fistula is near 0. Therefore, further intervention was aborted.  Attention was turned to the right neck for placement of a tunneled hemodialysis catheter.  The right neck was interrogated with ultrasound. The right internal jugular vein is atretic and thick walled and cannot be visualized directly to  the subclavian vein. The subclavian vein and external jugular vein are slightly hyper trophic. 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 As the subclavian vein was easily visible from a supraclavicular approach, it was decided to place the catheter directly into this vessel. Local anesthesia was attained by infiltration with 1% lidocaine. A small dermatotomy was made. Under real-time sonographic guidance (an image was obtained and stored for the medical record) the vessels punctured with a 21 gauge micropuncture needle. With the assistance of a 5 French micro sheath, a 0.018 inch wire was advanced into the heart in the intravascular depth measured. The micro wire was then removed and a 0.035 inch wire was advanced through the heart and into the inferior vena cava.  A suitable skin exit site inferior to the clavicle was selected. Local anesthesia was attained by 1% lidocaine infiltration. A small dermatotomy was made. A 19 cm tip to cuff Bard hemo split hemodialysis catheter was then tunneled from the skin exit site to the dermatotomy overlying the venous access site. A peel-away sheath was then advanced over the Bentson wire and into the right heart. The tunneled hemodialysis catheter was then fed through the peel-away sheath and the tips positioned in the upper right atrium under fluoroscopic imaging. An image was obtained and stored.  The catheter flushed and aspirated with ease and was locked with heparinized saline. The catheter was then secured to the skin with 0 Prolene suture and a sterile bandage. The dermatotomy over the venous access site was sealed with Dermabond. The patient tolerated the procedure well.  COMPLICATIONS: None  IMPRESSION: 1. 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 2. Unsuccessful declot procedure secondary to very poor arterial inflow from a small, atherosclerotic and atretic radial artery. 3. Successful placement of a right IJ approach 19 cm tip to cuff tunneled  hemodialysis catheter. Catheter tips are in the upper right atrium and ready for immediate use.  PLAN: 1. Recommend referral to vascular surgery for revision of the right upper extremity arteriovenous fistula. Signed,  Criselda Peaches, MD  [facility]  [facility]iology   Electronically Signed   By: Jacqulynn Cadet M.D.   On: 07/15/2014 17:16   Ir US Guide Vasc Access Right  07/15/2014   CLINICAL DATA:  54 year old male with end-stage renal disease on hemodialysis. His right forearm  EXAM: IR  RIGHT DECLOT; PTA VENOUS; ARTERIOVENOUS SHUNT FOR DIALYSIS; ADDITIONAL ACCESS; IR ULTRASOUND GUIDANCE VASC ACCESS RIGHT; IR RIGHT FLOURO GUIDE CV LINE  Date: 07/15/2014  PROCEDURE: 1. Ultrasound-guided antegrade puncture adjacent to the arterial anastomosis 2. Ultrasound-guided retrograde puncture 3. Catheterization of the right subclavian vein with central and pull-back venogram 4. 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 Pharmacomechanical thrombolysis/thrombectomy declot procedure 6. Attempted catheterization of the arterial inflow with limited arteriogram 7. Ultrasound-guided puncture of the right subclavian vein from a supraclavicular approach 8. Placement of a 19 cm tip to cuff tunneled hemodialysis catheter Interventional Radiologist:  Criselda Peaches, MD  ANESTHESIA/SEDATION: Moderate (conscious) sedation was used. 2 mg Versed, 100 mcg Fentanyl were administered intravenously. The patient's vital signs were monitored continuously by radiology nursing throughout the procedure.  Sedation Time: 80 minutes  MEDICATIONS: 2 g Ancef and 3000 units heparin administered intravenously. Ancef was administered within 1 hours of skin incision.  FLUOROSCOPY TIME:  15 minutes 30 seconds  CONTRAST:  77m OMNIPAQUE IOHEXOL 300 MG/ML  SOLN  TECHNIQUE: Informed consent was obtained from the patient following explanation of the procedure, risks, benefits and alternatives.  The patient understands, agrees and consents for the procedure. All questions were addressed. A time out was performed.  Maximal barrier sterile technique utilized including caps, mask, sterile gowns, sterile gloves, large sterile drape, hand hygiene, and Betadine skin prep.  The right upper extremity arteriovenous fistula was interrogated with ultrasound. There is subocclusive clot throughout the main stick zone. The vein proximal and distal to this is patent and compressible. However, there is a very weak pulse suggesting possible inflow disease versus high-grade central stenosis.  Local anesthesia was attained by infiltration with 1% lidocaine. Under real-time sonographic guidance, the vein was punctured in antegrade fashion several cm from the arterial anastomosis. A transitional 5 French micro sheath was advanced over a micro wire into the stick zone. 2 mg of tPA was then injected into the thrombus. This was manually macerated by gentle massage while the outflow vein was capped compressed with the ultrasound probe. 3000 units of heparin was injected intravenously.  The transitional micro sheath was then exchanged over a Bentson wire for a working 7 [place] sheath. A Kumpe the catheter was advanced into the right subclavian vein and a central venogram was performed. 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 A pull-back venogram was then performed demonstrating patency of the cephalic arch and proximal cephalic vein.  A Rosen wire was next advanced into the superior vena cava and the subclavian vein was angioplasty to 10 mm using a 10 x 40 mm Conquest balloon. The inflation was maintained at 20 atmospheres with full effacement for 2 minutes. Following angioplasty, the balloon was removed. The 6 French Angiojet device was then inserted over the [place] and rheolytic thrombectomy/thrombolysis was performed throughout the region of the  fistula with internal thrombus. The Angiojet was then removed and a 7 x 40 mm Conquest balloon was advanced through the sheath an used to further balloon macerate and angioplasty the stick zone and proximal draining vein.  Following this, aspiration was performed through the sheath. There was blood flow however it was extremely week. Therefore, using similar technique to above, ultrasound-guided puncture of the draining vein in retrograde fashion directed toward the arterial anastomosis was performed with a 21 gauge micropuncture needle. An image was obtained and stored for the medical record. Using the transitional  micro sheath, a working 6 [place] vascular sheath was advanced into the vein. A Kumpe the catheter and Glidewire were used to advanced into the arterial anastomosis. A gentle hand injection of contrast material demonstrates stasis within the inflow artery secondary to the 5 French catheter being occlusive. This was confirmed is the catheter was gently removed. Once back within the main draining vein the contrast column slowly washed out into the fistula. There is no distal flow which represents an interval change compared to October of 2014. Unfortunately, the inflow radial artery is extremely atretic. There is no outflow distal to the stenosis. The vessel measures approximately 2 mm in diameter.  Attempts were made to gently catheterize the very small radial artery, however the 5 French catheter was occlusive and a wire could not make the 90 degree bend. Given the very poor arterial inflow, the chance of obtaining sustained patency of this fistula is near 0. Therefore, further intervention was aborted.  Attention was turned to the right neck for placement of a tunneled hemodialysis catheter.  The right neck was interrogated with ultrasound. The right internal jugular vein is atretic and thick walled and cannot be visualized directly to the subclavian vein. The subclavian vein and external jugular vein  are slightly hyper trophic. 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 As the subclavian vein was easily visible from a supraclavicular approach, it was decided to place the catheter directly into this vessel. Local anesthesia was attained by infiltration with 1% lidocaine. A small dermatotomy was made. Under real-time sonographic guidance (an image was obtained and stored for the medical record) the vessels punctured with a 21 gauge micropuncture needle. With the assistance of a 5 French micro sheath, a 0.018 inch wire was advanced into the heart in the intravascular depth measured. The micro wire was then removed and a 0.035 inch wire was advanced through the heart and into the inferior vena cava.  A suitable skin exit site inferior to the clavicle was selected. Local anesthesia was attained by 1% lidocaine infiltration. A small dermatotomy was made. A 19 cm tip to cuff Bard hemo split hemodialysis catheter was then tunneled from the skin exit site to the dermatotomy overlying the venous access site. A peel-away sheath was then advanced over the Bentson wire and into the right heart. The tunneled hemodialysis catheter was then fed through the peel-away sheath and the tips positioned in the upper right atrium under fluoroscopic imaging. An image was obtained and stored.  The catheter flushed and aspirated with ease and was locked with heparinized saline. The catheter was then secured to the skin with 0 Prolene suture and a sterile bandage. The dermatotomy over the venous access site was sealed with Dermabond. The patient tolerated the procedure well.  COMPLICATIONS: None  IMPRESSION: 1. 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 2. Unsuccessful declot procedure secondary to very poor arterial inflow from a small, atherosclerotic and atretic radial artery. 3. Successful placement of a right IJ approach 19 cm tip to cuff tunneled hemodialysis catheter. Catheter tips are in the upper right atrium  and ready for immediate use.  PLAN: 1. Recommend referral to vascular surgery for revision of the right upper extremity arteriovenous fistula. Signed,  Criselda Peaches, MD  [facility]  [facility]   Electronically Signed   By: Jacqulynn Cadet M.D.   On: 07/15/2014 17:16   Dg Chest Port 1 View  07/15/2014   CLINICAL DATA:  Preoperative exam  prior to declining procedure for right arm dialysis fistula  EXAM: PORTABLE CHEST - 1 VIEW  COMPARISON:  PA and lateral chest x-ray of July 13, 2014  FINDINGS: The lungs are reasonably well inflated and clear. The cardiac silhouette is top-normal in size. The pulmonary vascularity is within the limits of normal. There is no pleural effusion. The mediastinum is mildly widened though stable. The trachea is midline. The bony thorax is unremarkable.  IMPRESSION: Mild stable enlargement of the cardiac silhouette. There is no active cardiopulmonary disease.   Electronically Signed   By: David  Martinique   On: 07/15/2014 07:30   Dg Abd Portable 1v  07/14/2014   CLINICAL DATA:  Right femoral line placement.  EXAM: PORTABLE ABDOMEN - 1 VIEW  COMPARISON:  None.  FINDINGS: Tip of the presumed right femoral catheter is to the right of L5, in the region of the common iliac vein. There is air throughout normal caliber colon. No dilated bowel loops to suggest obstruction. Surgical clips in the right upper quadrant of the abdomen likely from cholecystectomy.  IMPRESSION: Tip of the right femoral catheter in the region of the right common iliac vein.   Electronically Signed   By: Jeb Levering M.D.   On: 07/14/2014 01:59   Ir Declot Right Mod Sed  07/15/2014   CLINICAL DATA:  54 year old male with end-stage renal disease on hemodialysis. His right forearm  EXAM: IR RIGHT DECLOT; PTA VENOUS; ARTERIOVENOUS SHUNT FOR DIALYSIS; ADDITIONAL ACCESS; IR ULTRASOUND GUIDANCE VASC ACCESS RIGHT; IR RIGHT FLOURO GUIDE CV LINE  Date: 07/15/2014   PROCEDURE: 1. Ultrasound-guided antegrade puncture adjacent to the arterial anastomosis 2. Ultrasound-guided retrograde puncture 3. Catheterization of the right subclavian vein with central and pull-back venogram 4. 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 Pharmacomechanical thrombolysis/thrombectomy declot procedure 6. Attempted catheterization of the arterial inflow with limited arteriogram 7. Ultrasound-guided puncture of the right subclavian vein from a supraclavicular approach 8. Placement of a 19 cm tip to cuff tunneled hemodialysis catheter Interventional Radiologist:  Criselda Peaches, MD  ANESTHESIA/SEDATION: Moderate (conscious) sedation was used. 2 mg Versed, 100 mcg Fentanyl were administered intravenously. The patient's vital signs were monitored continuously by radiology nursing throughout the procedure.  Sedation Time: 80 minutes  MEDICATIONS: 2 g Ancef and 3000 units heparin administered intravenously. Ancef was administered within 1 hours of skin incision.  FLUOROSCOPY TIME:  15 minutes 30 seconds  CONTRAST:  85m OMNIPAQUE IOHEXOL 300 MG/ML  SOLN  TECHNIQUE: Informed consent was obtained from the patient following explanation of the procedure, risks, benefits and alternatives. The patient understands, agrees and consents for the procedure. All questions were addressed. A time out was performed.  Maximal barrier sterile technique utilized including caps, mask, sterile gowns, sterile gloves, large sterile drape, hand hygiene, and Betadine skin prep.  The right upper extremity arteriovenous fistula was interrogated with ultrasound. There is subocclusive clot throughout the main stick zone. The vein proximal and distal to this is patent and compressible. However, there is a very weak pulse suggesting possible inflow disease versus high-grade central stenosis.  Local anesthesia was attained by infiltration with 1% lidocaine. Under real-time sonographic guidance, the vein was punctured in  antegrade fashion several cm from the arterial anastomosis. A transitional 5 French micro sheath was advanced over a micro wire into the stick zone. 2 mg of tPA was then injected into the thrombus. This was manually macerated by gentle massage while the outflow vein was capped compressed with the ultrasound probe.  3000 units of heparin was injected intravenously.  The transitional micro sheath was then exchanged over a Bentson wire for a working 7 [place] vascular sheath. A Kumpe the catheter was advanced into the right subclavian vein and a central venogram was performed. 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 A pull-back venogram was then performed demonstrating patency of the cephalic arch and proximal cephalic vein.  A Rosen wire was next advanced into the superior vena cava and the subclavian vein was angioplasty to 10 mm using a 10 x 40 mm Conquest balloon. The inflation was maintained at 20 atmospheres with full effacement for 2 minutes. Following angioplasty, the balloon was removed. The 6 French Angiojet device was then inserted over the [place] wire and rheolytic thrombectomy/thrombolysis was performed throughout the region of the fistula with internal thrombus. The Angiojet was then removed and a 7 x 40 mm Conquest balloon was advanced through the sheath an used to further balloon macerate and angioplasty the stick zone and proximal draining vein.  Following this, aspiration was performed through the sheath. There was blood flow however it was extremely week. Therefore, using similar technique to above, ultrasound-guided puncture of the draining vein in retrograde fashion directed toward the arterial anastomosis was performed with a 21 gauge micropuncture needle. An image was obtained and stored for the medical record. Using the transitional micro sheath, a working 6 [place] vascular sheath was advanced into the vein. A Kumpe the catheter  and Glidewire were used to advanced into the arterial anastomosis. A gentle hand injection of contrast material demonstrates stasis within the inflow artery secondary to the 5 French catheter being occlusive. This was confirmed is the catheter was gently removed. Once back within the main draining vein the contrast column slowly washed out into the fistula. There is no distal flow which represents an interval change compared to October of 2014. Unfortunately, the inflow radial artery is extremely atretic. There is no outflow distal to the stenosis. The vessel measures approximately 2 mm in diameter.  Attempts were made to gently catheterize the very small radial artery, however the 5 French catheter was occlusive and a wire could not make the 90 degree bend. Given the very poor arterial inflow, the chance of obtaining sustained patency of this fistula is near 0. Therefore, further intervention was aborted.  Attention was turned to the right neck for placement of a tunneled hemodialysis catheter.  The right neck was interrogated with ultrasound. The right internal jugular vein is atretic and thick walled and cannot be visualized directly to the subclavian vein. The subclavian vein and external jugular vein are slightly hyper trophic. 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 As the subclavian vein was easily visible from a supraclavicular approach, it was decided to place the catheter directly into this vessel. Local anesthesia was attained by infiltration with 1% lidocaine. A small dermatotomy was made. Under real-time sonographic guidance (an image was obtained and stored for the medical record) the vessels punctured with a 21 gauge micropuncture needle. With the assistance of a 5 French micro sheath, a 0.018 inch wire was advanced into the heart in the intravascular depth measured. The micro wire was then removed and a 0.035 inch wire was advanced through the heart and into the inferior vena cava.  A  suitable skin exit site inferior to the clavicle was selected. Local anesthesia was attained by 1% lidocaine infiltration. A small dermatotomy was made.  A 19 cm tip to cuff Bard hemo split hemodialysis catheter was then tunneled from the skin exit site to the dermatotomy overlying the venous access site. A peel-away sheath was then advanced over the Bentson wire and into the right heart. The tunneled hemodialysis catheter was then fed through the peel-away sheath and the tips positioned in the upper right atrium under fluoroscopic imaging. An image was obtained and stored.  The catheter flushed and aspirated with ease and was locked with heparinized saline. The catheter was then secured to the skin with 0 Prolene suture and a sterile bandage. The dermatotomy over the venous access site was sealed with Dermabond. The patient tolerated the procedure well.  COMPLICATIONS: None  IMPRESSION: 1. 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 2. Unsuccessful declot procedure secondary to very poor arterial inflow from a small, atherosclerotic and atretic radial artery. 3. Successful placement of a right IJ approach 19 cm tip to cuff tunneled hemodialysis catheter. Catheter tips are in the upper right atrium and ready for immediate use.  PLAN: 1. Recommend referral to vascular surgery for revision of the right upper extremity arteriovenous fistula. Signed,  Criselda Peaches, MD  [facility]  [facility]   Electronically Signed   By: Jacqulynn Cadet M.D.   On: 07/15/2014 17:16     Microbiology: Recent Results (from the past 240 hour(s))  MRSA PCR Screening     Status: None   Collection Time: 07/13/14 10:14 PM  Result Value Ref Range Status   MRSA by PCR NEGATIVE NEGATIVE Final    Comment:        The GeneXpert MRSA Assay (FDA approved for NASAL specimens only), is one component of a comprehensive MRSA colonization surveillance program. It is  not intended to diagnose MRSA infection nor to guide or monitor treatment for MRSA infections.   Culture, blood (routine x 2)     Status: None (Preliminary result)   Collection Time: 07/14/14 11:30 AM  Result Value Ref Range Status   Specimen Description BLOOD LEFT HAND  Final   Special Requests BOTTLES DRAWN AEROBIC ONLY 8CC  Final   Culture   Final           BLOOD CULTURE RECEIVED NO GROWTH TO DATE CULTURE WILL BE HELD FOR 5 DAYS BEFORE ISSUING A FINAL NEGATIVE REPORT Performed at [facility]    Report Status PENDING  Incomplete  Culture, blood (routine x 2)     Status: None (Preliminary result)   Collection Time: 07/14/14 12:00 PM  Result Value Ref Range Status   Specimen Description BLOOD LEFT HAND  Final   Special Requests BOTTLES DRAWN AEROBIC ONLY 10CC  Final   Culture   Final           BLOOD CULTURE RECEIVED NO GROWTH TO DATE CULTURE WILL BE HELD FOR 5 DAYS BEFORE ISSUING A FINAL NEGATIVE REPORT Performed at [facility]    Report Status PENDING  Incomplete  Clostridium Difficile by PCR     Status: None   Collection Time: 07/16/14  4:22 AM  Result Value Ref Range Status   C difficile by pcr NEGATIVE NEGATIVE Final  Surgical pcr screen     Status: None   Collection Time: 07/17/14  5:39 AM  Result Value Ref Range Status   MRSA, PCR NEGATIVE NEGATIVE Final   Staphylococcus aureus NEGATIVE NEGATIVE Final    Comment:        The Xpert SA Assay (  FDA approved for NASAL specimens in patients over 38 years of age), is one component of a comprehensive surveillance program.  Test performance has been validated by [facility] for patients greater than or equal to 23 year old. It is not intended to diagnose infection nor to guide or monitor treatment.      Labs: Results for orders placed or performed during the hospital encounter of 07/13/14 (from the past 48 hour(s))  Glucose, capillary     Status: Abnormal   Collection Time: 07/16/14  4:13 PM  Result  Value Ref Range   Glucose-Capillary 181 (H) 70 - 99 mg/dL  Glucose, capillary     Status: Abnormal   Collection Time: 07/16/14  8:04 PM  Result Value Ref Range   Glucose-Capillary 237 (H) 70 - 99 mg/dL  Glucose, capillary     Status: Abnormal   Collection Time: 07/17/14 12:12 AM  Result Value Ref Range   Glucose-Capillary 207 (H) 70 - 99 mg/dL  Glucose, capillary     Status: Abnormal   Collection Time: 07/17/14  4:10 AM  Result Value Ref Range   Glucose-Capillary 170 (H) 70 - 99 mg/dL  CBC     Status: Abnormal   Collection Time: 07/17/14  5:00 AM  Result Value Ref Range   WBC 8.4 4.0 - 10.5 K/uL   RBC 2.80 (L) 4.22 - 5.81 MIL/uL   Hemoglobin 8.2 (L) 13.0 - 17.0 g/dL   HCT 23.9 (L) 39.0 - 52.0 %   MCV 85.4 78.0 - 100.0 fL   MCH 29.3 26.0 - 34.0 pg   MCHC 34.3 30.0 - 36.0 g/dL   RDW 13.3 11.5 - 15.5 %   Platelets 157 150 - 400 K/uL  Protime-INR     Status: None   Collection Time: 07/17/14  5:00 AM  Result Value Ref Range   Prothrombin Time 14.3 11.6 - 15.2 seconds   INR 1.10 0.00 - 1.49  Comprehensive metabolic panel     Status: Abnormal   Collection Time: 07/17/14  5:00 AM  Result Value Ref Range   Sodium 133 (L) 135 - 145 mmol/L   Potassium 3.8 3.5 - 5.1 mmol/L   Chloride 101 96 - 112 mmol/L   CO2 24 19 - 32 mmol/L   Glucose, Bld 167 (H) 70 - 99 mg/dL   BUN 43 (H) 6 - 23 mg/dL   Creatinine, Ser 10.21 (H) 0.50 - 1.35 mg/dL   Calcium 7.9 (L) 8.4 - 10.5 mg/dL   Total Protein 5.7 (L) 6.0 - 8.3 g/dL   Albumin 2.5 (L) 3.5 - 5.2 g/dL   AST 158 (H) 0 - 37 U/L   ALT 67 (H) 0 - 53 U/L   Alkaline Phosphatase 246 (H) 39 - 117 U/L   Total Bilirubin 1.0 0.3 - 1.2 mg/dL   GFR calc non Af Amer 5 (L) >90 mL/min   GFR calc Af Amer 6 (L) >90 mL/min    Comment: (NOTE) The eGFR has been calculated using the CKD EPI equation. This calculation has not been validated in all clinical situations. eGFR's persistently <90 mL/min signify possible Chronic Kidney Disease.    Anion gap 8 5 -  15  Surgical pcr screen     Status: None   Collection Time: 07/17/14  5:39 AM  Result Value Ref Range   MRSA, PCR NEGATIVE NEGATIVE   Staphylococcus aureus NEGATIVE NEGATIVE    Comment:        The Xpert SA Assay (FDA approved for  NASAL specimens in patients over 49 years of age), is one component of a comprehensive surveillance program.  Test performance has been validated by [facility] for patients greater than or equal to 45 year old. It is not intended to diagnose infection nor to guide or monitor treatment.   Glucose, capillary     Status: Abnormal   Collection Time: 07/17/14  7:42 AM  Result Value Ref Range   Glucose-Capillary 143 (H) 70 - 99 mg/dL   Comment 1 Documented in Char   Glucose, capillary     Status: Abnormal   Collection Time: 07/17/14 11:03 AM  Result Value Ref Range   Glucose-Capillary 161 (H) 70 - 99 mg/dL   Comment 1 Notify RN    Comment 2 Documented in Char   Glucose, capillary     Status: Abnormal   Collection Time: 07/17/14  5:00 PM  Result Value Ref Range   Glucose-Capillary 105 (H) 70 - 99 mg/dL  Glucose, capillary     Status: Abnormal   Collection Time: 07/17/14  8:32 PM  Result Value Ref Range   Glucose-Capillary 172 (H) 70 - 99 mg/dL  Glucose, capillary     Status: Abnormal   Collection Time: 07/18/14 12:29 AM  Result Value Ref Range   Glucose-Capillary 170 (H) 70 - 99 mg/dL  Glucose, capillary     Status: Abnormal   Collection Time: 07/18/14  4:28 AM  Result Value Ref Range   Glucose-Capillary 140 (H) 70 - 99 mg/dL  CBC     Status: Abnormal   Collection Time: 07/18/14  6:13 AM  Result Value Ref Range   WBC 7.1 4.0 - 10.5 K/uL   RBC 3.05 (L) 4.22 - 5.81 MIL/uL   Hemoglobin 8.8 (L) 13.0 - 17.0 g/dL   HCT 26.5 (L) 39.0 - 52.0 %   MCV 86.9 78.0 - 100.0 fL   MCH 28.9 26.0 - 34.0 pg   MCHC 33.2 30.0 - 36.0 g/dL   RDW 13.7 11.5 - 15.5 %   Platelets 163 150 - 400 K/uL  Comprehensive metabolic panel     Status: Abnormal   Collection  Time: 07/18/14  6:13 AM  Result Value Ref Range   Sodium 135 135 - 145 mmol/L   Potassium 4.0 3.5 - 5.1 mmol/L   Chloride 97 96 - 112 mmol/L   CO2 26 19 - 32 mmol/L   Glucose, Bld 160 (H) 70 - 99 mg/dL   BUN 18 6 - 23 mg/dL    Comment: DELTA CHECK NOTED   Creatinine, Ser 5.75 (H) 0.50 - 1.35 mg/dL    Comment: DELTA CHECK NOTED   Calcium 8.3 (L) 8.4 - 10.5 mg/dL   Total Protein 5.9 (L) 6.0 - 8.3 g/dL   Albumin 2.6 (L) 3.5 - 5.2 g/dL   AST 53 (H) 0 - 37 U/L   ALT 53 0 - 53 U/L   Alkaline Phosphatase 203 (H) 39 - 117 U/L   Total Bilirubin 0.4 0.3 - 1.2 mg/dL   GFR calc non Af Amer 10 (L) >90 mL/min   GFR calc Af Amer 12 (L) >90 mL/min    Comment: (NOTE) The eGFR has been calculated using the CKD EPI equation. This calculation has not been validated in all clinical situations. eGFR's persistently <90 mL/min signify possible Chronic Kidney Disease.    Anion gap 12 5 - 15  Glucose, capillary     Status: Abnormal   Collection Time: 07/18/14  8:38 AM  Result Value Ref  Range   Glucose-Capillary 154 (H) 70 - 99 mg/dL   Comment 1 Notify RN    Comment 2 Documented in Char      HPI :Theodore Hurley is a 54 y.o. 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 He presented to IR suite 2/22 for declotting of his fistula; however, was hypotensive on arrival so was sent to ED for further evaluation. He apparently only had partial dialysis last Wednesday due to access issues. In ED, he was found to have metabolic acidosis, significantly elevated BUN / SCr, elevated lipase. He also endorsed intermittent upper abd pain and vomiting since Wed 2/17. He denies any fevers/chill/sweats, myalgias, chest pain, SOB, diarrhea.  In ED, he was seen by nephrology (Dr. Justin Mend) who advised ICU admission and initiation of CRRT. PCCM consulted for admission and placement of temporary HD  catheter.   HOSPITAL COURSE:   End Stage Renal on hemodialysis Monday Wednesday Friday Disease with a dialysis access malfunction / Thrombosed access and inability to perform dialysis via right arm fistula, attempted  thrombolysis was unsuccessful. 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 Therefore R Fem HD cath placed 2/22 >>> R Fem A line 2/22 >>>2/24 discontinued Initially required CRRT stopped 2/23;  IR following: Status post thrombolysis of Rt arm dialysis fistula- and possible pta/stent placement on 2/24 which was unsuccessful He is Status post INSERTION OF ARTERIOVENOUS (AV) GORE-TEX GRAFT (59mx40cm) LEFT THIGH on 2/26  Stable. Postoperatively, seen by Dr. fOneida Alarprior to discharge   Hypotension - Has resolved after gentle hydration. Hypertension continue prn labetalol , Norvasc, lisinopril   Severe AG metabolic acidosis - due to uremia / renal failure- Resolved  Hyponatremia - Resolved  Mild hyperkalemia - received kayxelate in ED   ? Pancreatitis - lipase 105 + generalized abd pain - likely secondary to hypoperfusion Diarrhea-improving Repeat lipase improved 97 (2/23) < 399  C. difficile negative on 2/25 Started on when necessary Imodium, diarrhea improved   Anemia anemia of chronic disease secondary to ESRD, hemoglobin dropped to 6.6, improved to 9.1 status post transfusion of one unit of packed red blood cells. Hemoglobin 8.8 prior to discharge Positive FOBT, started on ppi  Thrombocytopenia improved platelet count 163 prior to discharge   AT RISK for vascular infection, infected clot Initial programmed the level was 2.46 low threshold for empiric ABX, ceftaz, vanc  This is the patient group that fly under radar and difficult to detect sepsis BCx NGTD  initiated on Abx Vanc/Ceftaz 2/23 (1pm) 2 days, now off antibiotics    Diabetes resume glipizide.    Hx CVA with mild residual left sided weakness Pain Continprovided with  when necessary Percocet     Discharge Exam:   Blood pressure 141/54, pulse 76, temperature 98.6 F (37 C), temperature source Oral, resp. rate 20, height 5' 3"  (1.6 m), weight 87.136 kg (192 lb 1.6 oz), SpO2 100 %.  General: Hispanic male, in NAD, speaks little english. Neuro: A&O x 3, non-focal.  HEENT: [place]/AT. PERRL, sclerae anicteric. Cardiovascular: RRR, no M/R/G.  Lungs: Respirations even and unlabored. CTA bilaterally, No W/R/R. Abdomen: BS x 4, soft, ND. Generalized tenderness with palpation. Musculoskeletal: No gross deformities, no edema.  Skin: Intact, warm, no rashes.        Discharge Instructions    Diet - low sodium heart healthy    Complete by:  As directed  Increase activity slowly    Complete by:  As directed            Follow-up Information    Follow up with Bobby Rumpf, MD. Schedule an appointment as soon as possible for a visit in 1 week.   Specialty:  Infectious Diseases   Contact information:   [place] [address] [phone]       Signed: Reyne Dumas 07/18/2014, 11:35 AM

## 2014-07-18 NOTE — Progress Notes (Signed)
[facility] [place]  Subjective  - sore   Objective 133/54 71 97.7 F (36.5 C) (Oral) 19 100%  Intake/Output Summary (Last 24 hours) at 07/18/14 1030 Last data filed at 07/17/14 1619  Gross per 24 hour  Intake    200 ml  Output   2025 ml  Net  -1825 ml   Left thigh graft incision clean audible bruit Chronic left foot numbness Foot is warm  Assessment/Planning: Patent thigh graft  Will sign off  Vaunda Gutterman E 07/18/2014 10:30 AM --  Laboratory Lab Results:  Recent Labs  07/17/14 0500 07/18/14 0613  WBC 8.4 7.1  HGB 8.2* 8.8*  HCT 23.9* 26.5*  PLT 157 163   BMET  Recent Labs  07/17/14 0500 07/18/14 0613  NA 133* 135  K 3.8 4.0  CL 101 97  CO2 24 26  GLUCOSE 167* 160*  BUN 43* 18  CREATININE 10.21* 5.75*  CALCIUM 7.9* 8.3*    COAG Lab Results  Component Value Date   INR 1.10 07/17/2014   INR 1.18 07/15/2014   No results found for: PTT

## 2014-07-18 NOTE — Evaluation (Signed)
Physical Therapy Evaluation Patient Details Name: Theodore Hurley MRN: [id number] DOB: 04-06-1961 Today's Date: 07/18/2014   History of Present Illness  Pt is a 54 y.o. male with a PMH of end-stage renal disease on hemodialysis on M, W, F, diabetes, hypertension, history of CVA with mild left-sided weakness, dyslipidemia. Per patient, his last full usual dialysis was last Monday (07/06/14), this past Wednesday he was only partially dialyze because of technical issues, and had access issues during HD that Friday. He was then referred by his nephrologist to [facility] for evaluation by radiology for a possible fistulogram, who subsequently referred him to interventional radiology at [facility] for further evaluation. When patient arrived at interventional radiology, he was found to be hypotensive, he was then immediately transferred to [facility] emergency room for further evaluation. His lab work showed significant metabolic acidosis and a significantly elevated BUN.  Clinical Impression  Pt admitted with above diagnosis. Pt currently with functional limitations due to the deficits listed below (see PT Problem List). At the time of PT eval pt was able to perform transfers and ambulation with supervision. Pt appeared to put forth a fair to poor rehab effort. Pt insisted he needed assistance with basic tasks when he was in fact at supervision level for mobility. At home, it appears that pt's family may provide unnecessary assist for ADL's.   Pt will benefit from skilled PT to increase their independence and safety with mobility to allow discharge to the venue listed below. Pt asking for HHPT to follow at d/c, and will need a RW for safety as pt reports his walker is old and wobbly.      Follow Up Recommendations Home health PT;Supervision for mobility/OOB    Equipment Recommendations  Rolling walker with 5" wheels    Recommendations for Other Services       Precautions /  Restrictions Precautions Precautions: Fall Restrictions Weight Bearing Restrictions: No      Mobility  Bed Mobility               General bed mobility comments: Pt sitting up in recliner upon PT arrival.   Transfers Overall transfer level: Needs assistance Equipment used: Rolling walker (2 wheeled) Transfers: Sit to/from Stand Sit to Stand: Supervision         General transfer comment: No assist to power-up to full standing.   Ambulation/Gait Ambulation/Gait assistance: Supervision Ambulation Distance (Feet): 75 Feet Assistive device: Rolling walker (2 wheeled) Gait Pattern/deviations: Step-through pattern;Decreased stride length;Trunk flexed Gait velocity: Decreased Gait velocity interpretation: Below normal speed for age/gender General Gait Details: Pt ambulating very slow and guarded. Pt reports fatigue after about 35'.   Stairs            Wheelchair Mobility    Modified Rankin (Stroke Patients Only)       Balance Overall balance assessment: No apparent balance deficits (not formally assessed)                                           Pertinent Vitals/Pain Pain Assessment: Faces Faces Pain Scale: Hurts a little bit Pain Location: Pt reports pain in his LLE due to AV graft placement Pain Descriptors / Indicators: Operative site guarding Pain Intervention(s): Limited activity within patient's tolerance;Monitored during session    Home Living Family/patient expects to be discharged to:: Private residence Living Arrangements: Spouse/significant other Available Help  at Discharge: Family;Available PRN/intermittently Type of Home: House Home Access: Ramped entrance     Home Layout: One level Home Equipment: Walker - 2 wheels      Prior Function Level of Independence: Needs assistance   Gait / Transfers Assistance Needed: Pt reports that he uses his walker all the time at home  ADL's / Homemaking Assistance Needed: Pt  reports that his wife assists him with anything on his left side due to weakness. Pt states he can wash his face, torso and right leg on his own.         Hand Dominance   Dominant Hand: Right    Extremity/Trunk Assessment   Upper Extremity Assessment: Defer to OT evaluation;Overall WFL for tasks assessed           Lower Extremity Assessment: Overall WFL for tasks assessed      Cervical / Trunk Assessment: Normal  Communication   Communication: Prefers language other than [place] (Spanish speaking however speaks english well)  Cognition Arousal/Alertness: Awake/alert Behavior During Therapy: WFL for tasks assessed/performed Overall Cognitive Status: Within Functional Limits for tasks assessed                      General Comments      Exercises        Assessment/Plan    PT Assessment Patient needs continued PT services  PT Diagnosis Difficulty walking;Acute pain   PT Problem List Decreased strength;Decreased activity tolerance;Decreased range of motion;Decreased balance;Decreased mobility;Decreased safety awareness;Decreased knowledge of use of DME;Decreased knowledge of precautions;Pain  PT Treatment Interventions DME instruction;Gait training;Stair training;Functional mobility training;Therapeutic activities;Therapeutic exercise;Neuromuscular re-education;Patient/family education   PT Goals (Current goals can be found in the Care Plan section) Acute Rehab PT Goals Patient Stated Goal: Home today PT Goal Formulation: With patient Time For Goal Achievement: 07/25/14 Potential to Achieve Goals: Good    Frequency Min 3X/week   Barriers to discharge        Co-evaluation               End of Session Equipment Utilized During Treatment: Gait belt Activity Tolerance: Patient limited by fatigue Patient left: in chair;with call bell/phone within reach;with family/visitor present Nurse Communication: Mobility status         Time: [id number] PT  Time Calculation (min) (ACUTE ONLY): 17 min   Charges:   PT Evaluation $Initial PT Evaluation Tier I: 1 Procedure     PT G CodesConni Slipper:        Terrell Shimko 07/18/2014, 12:16 PM  Conni SlipperLaura Oakley Orban, PT, DPT Acute Rehabilitation Services Pager: [phone]

## 2014-07-19 LAB — POCT I-STAT 4, (NA,K, GLUC, HGB,HCT)
Glucose, Bld: 143 mg/dL — ABNORMAL HIGH (ref 70–99)
HCT: 27 % — ABNORMAL LOW (ref 39.0–52.0)
HEMOGLOBIN: 9.2 g/dL — AB (ref 13.0–17.0)
POTASSIUM: 3.9 mmol/L (ref 3.5–5.1)
Sodium: 135 mmol/L (ref 135–145)

## 2014-07-20 ENCOUNTER — Encounter (HOSPITAL_COMMUNITY): Payer: Self-pay | Admitting: Vascular Surgery

## 2014-07-20 LAB — CULTURE, BLOOD (ROUTINE X 2)
CULTURE: NO GROWTH
Culture: NO GROWTH

## 2014-07-21 LAB — GLUCOSE, CAPILLARY: Glucose-Capillary: 154 mg/dL — ABNORMAL HIGH (ref 70–99)

## 2015-07-27 ENCOUNTER — Ambulatory Visit (HOSPITAL_COMMUNITY): Payer: Medicaid Other | Attending: *Deleted | Admitting: Physical Therapy

## 2015-07-27 DIAGNOSIS — T148 Other injury of unspecified body region: Secondary | ICD-10-CM | POA: Insufficient documentation

## 2015-07-27 DIAGNOSIS — T148XXA Other injury of unspecified body region, initial encounter: Secondary | ICD-10-CM

## 2015-07-27 DIAGNOSIS — X58XXXA Exposure to other specified factors, initial encounter: Secondary | ICD-10-CM | POA: Diagnosis not present

## 2015-07-27 NOTE — Therapy (Addendum)
[place], [place], [address] Phone: [phone]   Fax:  [phone]  Wound Care Evaluation  Patient Details  Name: Theodore Hurley MRN: [id number] Date of Birth: 1961-02-13 No Data Recorded  Encounter Date: 07/27/2015      PT End of Session - 07/27/15 1554    Visit Number 1   Number of Visits 8   Date for PT Re-Evaluation 08/26/15   Authorization Type medicaid( trying to transfer pt to the wound center)   PT Start Time 1155   PT Stop Time 1230   PT Time Calculation (min) 35 min   Activity Tolerance Patient tolerated treatment well      Past Medical History  Diagnosis Date  . Diabetes mellitus without complication   . Hypertension   . Stroke     slight left side weakness  . Chronic kidney disease     ESRD Dialysis MWF  . Dyslipidemia     Past Surgical History  Procedure Laterality Date  . Cholecystectomy    . Av fistula placement Right   . Av fistula placement Left 07/17/2014    Procedure: INSERTION OF ARTERIOVENOUS (AV) GORE-TEX GRAFT (63mx40cm) LEFT THIGH;  Surgeon: JMal Misty MD;  [facility]  Service: Vascular;  Laterality: Left;    There were no vitals filed for this visit.  Visit Diagnosis:  Nonhealing nonsurgical wound         Wound Therapy - 07/27/15 1545    Subjective Mr. SStaniszewskistates that he slipped on his porch steps about a month ago and scrapped the inside of his left heel.  This caused a wound which has not yet healed.  He states he has diminished sensation from a stroke; therefore there is no pain.    Patient and Family Stated Goals For the wound to heal    Date of Onset 06/24/15   Prior Treatments self care.    Evaluation and Treatment Procedures Explained to Patient/Family Yes   Evaluation and Treatment Procedures agreed to   Wound Properties Date First Assessed: 07/27/15 Time First Assessed: 1200 Wound Type: Other (Comment) , scrapping  Location: Heel Location  Orientation: Medial Wound Description (Comments): most anterior of the two wounds    Dressing Type Gauze (Comment);Moist to moist   Dressing Changed New   Dressing Status None   Dressing Change Frequency Every other day   Site / Wound Assessment Clean;Dry;Brown   % Wound base Red or Granulating 20%   % Wound base Other (Comment) 80%  brown scab    Peri-wound Assessment Intact   Wound Length (cm) 1 cm   Wound Width (cm) 0.8 cm   Wound Depth (cm) --  unknown   Drainage Amount None   Treatment Cleansed;Debridement (Selective)   Wound Properties Date First Assessed: 07/27/15 Time First Assessed: 1200 Wound Type: Other (Comment) Location: Heel Location Orientation: Medial Wound Description (Comments): More posterior of the two wounds  Present on Admission: Yes   Dressing Type Gauze (Comment);Moist to moist   Dressing Changed New   Dressing Status None   Dressing Change Frequency Every other day   Site / Wound Assessment Dry   % Wound base Red or Granulating 20%   % Wound base Other (Comment) 80%   Peri-wound Assessment Intact   Wound Length (cm) 0.9 cm   Wound Width (cm) 0.9 cm   Wound Depth (cm) --  unknown   Drainage Amount None   Treatment Cleansed;Debridement (Selective)  Selective Debridement - Location --  wound bed   Selective Debridement - Tools Used Forceps;Scissors   Selective Debridement - Tissue Removed scab, slough    Wound Therapy - Clinical Statement Theodore Hurley is a 55yo male who is on dialysis, has diabetes, and high blood pressure.  He slipped scraped the inside of his left heel causing a wound over a month ago which has not healed.  He went to the health department to have the area assessed and was referred to physcial therapy.  We explained to the patient that we are going to try and have the patient transfered to the wound center due to the fact that his insurance will not cover physical therapy appointments.  If we are not able to transfer the patient we will see  him here at the clinic twice a week for four weeks or until the wounds are healed.      Wound Therapy - Functional Problem List None noted.    Factors Delaying/Impairing Wound Healing Altered sensation;Diabetes Mellitus;Multiple medical problems;Polypharmacy;Other (comment)   Hydrotherapy Plan Debridement;Dressing change;Patient/family education   Wound Therapy - Frequency --  twice a week for four weeks    Wound Therapy - Current Recommendations Other (comment)  wound center if able    Wound Plan Pt to be seen 2 times a week for 4 weeks or until wounds are healed for debridment and dressing change.    Dressing  moist to moist with vasilne, 2x2 , kling and netting.    Decrease Necrotic Tissue to STG:  2 weeks 0%to decrease risk of infection.    Decrease Necrotic Tissue - Progress Goal set today   Increase Granulation Tissue to STG:  4 weeks 100%   Increase Granulation Tissue - Progress Goal set today   Decrease Length/Width/Depth by (cm) LTG: 4 weeks: healed. to allow pt greater ease to done and doff shoes and socks.    Decrease Length/Width/Depth - Progress Goal set today   Patient/Family will be able to  STG:  2 Days: Be able to state the importance of keeping wounds covered during healing process:    Patient/Family Instruction Goal - Progress Goal set today   Goals/treatment plan/discharge plan were made with and agreed upon by patient/family Yes   Time For Goal Achievement --  4 weeks    Wound Therapy - Potential for Goals Good               PT Education - 07/27/15 1553    Education provided Yes   Education Details to scrub area while in shower, keep moist and covered.  Chance dressing every other day   Person(s) Educated Patient   Methods Explanation   Comprehension Verbalized understanding            Problem List Patient Active Problem List   Diagnosis Date Noted  . Anemia of renal disease   . Clotted dialysis access [facility])   . Dialysis AV fistula malfunction  ([place])   . Arterial hypotension   . Uremia 07/13/2014  . Hypotension (arterial) 07/13/2014  . Dyslipidemia 07/13/2014  . DM2 (diabetes mellitus, type 2) ([place]) 10/01/2007  . Essential hypertension 10/01/2007  . End stage renal disease ([place]) 10/01/2007  . TB SKIN TEST, POSITIVE 10/01/2007    Rayetta Humphrey, PT CLT [phone] 07/27/2015, 3:55 PM  [facility] [address] [place], [place], [address] Phone: [phone]   Fax:  [phone]  Name: Theodore Hurley MRN: [id number] Date of Birth:  10/30/1960   PHYSICAL THERAPY DISCHARGE SUMMARY  Visits from Start of Care: 1  Current functional level related to goals / functional outcomes: Unknown pt did not return for further visits    Remaining deficits: unknown   Education / Equipment: Keep wound moist   Plan: Patient agrees to discharge.  Patient goals were not met. Patient is being discharged due to not returning since the last visit.  ?????       Rayetta Humphrey, [place] CLT [phone]

## 2015-07-29 ENCOUNTER — Ambulatory Visit (HOSPITAL_COMMUNITY): Payer: Medicaid Other | Admitting: Physical Therapy

## 2015-08-03 ENCOUNTER — Ambulatory Visit (HOSPITAL_COMMUNITY): Payer: Medicaid Other

## 2015-08-05 ENCOUNTER — Ambulatory Visit (HOSPITAL_COMMUNITY): Payer: Medicaid Other

## 2015-08-10 ENCOUNTER — Ambulatory Visit (HOSPITAL_COMMUNITY): Payer: Medicaid Other

## 2015-08-10 ENCOUNTER — Telehealth (HOSPITAL_COMMUNITY): Payer: Self-pay

## 2015-08-12 ENCOUNTER — Ambulatory Visit (HOSPITAL_COMMUNITY): Payer: Medicaid Other | Admitting: Physical Therapy

## 2015-08-17 ENCOUNTER — Ambulatory Visit (HOSPITAL_COMMUNITY): Payer: Medicaid Other | Admitting: Physical Therapy

## 2015-08-17 ENCOUNTER — Telehealth (HOSPITAL_COMMUNITY): Payer: Self-pay | Admitting: Physical Therapy

## 2015-08-17 NOTE — Telephone Encounter (Signed)
Pt was referred to [facility] due to their insurance. We offered to continue to treat patient and explained that patient would be responsible for bill b/c Medicaid will not cover our services since we don't have a doctor on staff here like [facility]. N

## 2015-08-19 ENCOUNTER — Ambulatory Visit (HOSPITAL_COMMUNITY): Payer: Medicaid Other | Admitting: Physical Therapy

## 2015-10-25 ENCOUNTER — Telehealth: Payer: Self-pay | Admitting: Neurology

## 2015-10-25 NOTE — Telephone Encounter (Signed)
reviewed

## 2015-10-25 NOTE — Telephone Encounter (Addendum)
FYI-PT MEDICAID TERMED PER [facility] [phone] APPT CX'D

## 2015-10-26 ENCOUNTER — Ambulatory Visit: Payer: Self-pay | Admitting: Neurology

## 2016-06-29 IMAGING — US IR DECLOT *R*
2 series · 4 of 4 positions shown · non-contrast
Comparison: none

CLINICAL DATA: 53-year-old male with end-stage renal disease on
hemodialysis. His right forearm
TECHNIQUE: Informed consent was obtained from the patient following explanation
of the procedure, risks, benefits and alternatives. The patient
understands, agrees and consents for the procedure. All questions
were addressed. A time out was performed.

[Series 1: ir chest fluoro · 2 of 2 slices shown (1 of 2)]
[im 1/2]
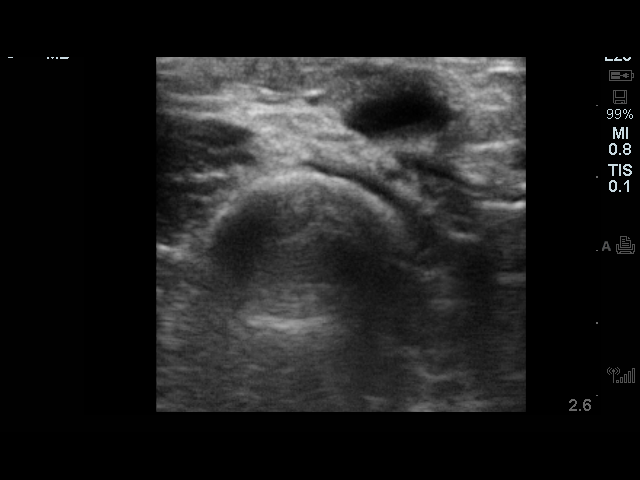
[im 2/2]
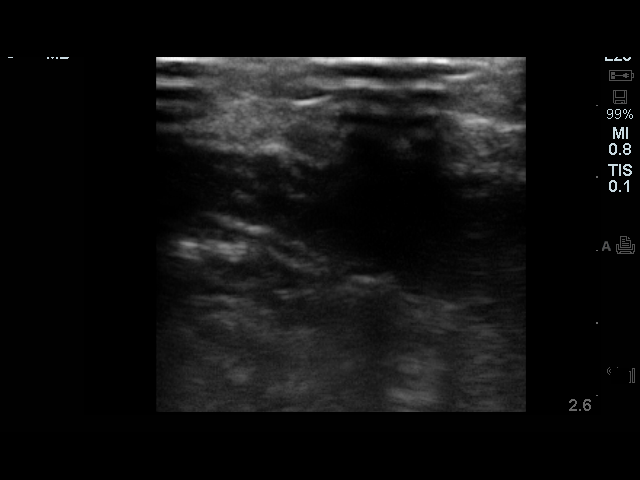

[Series 1: ir chest fluoro · 2 of 2 slices shown (2 of 2)]
[im 1/2]
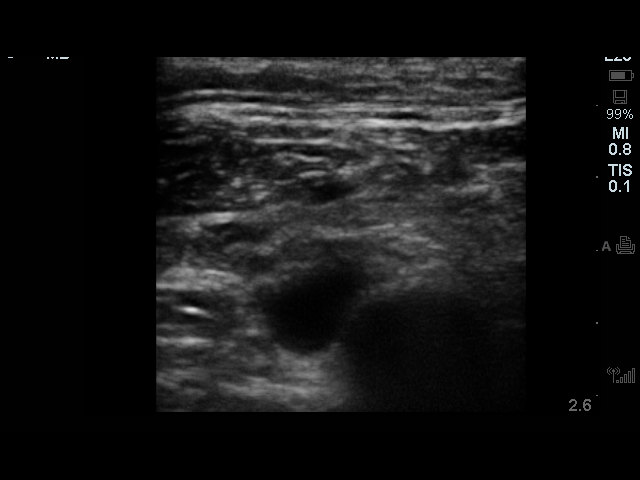
[im 2/2]
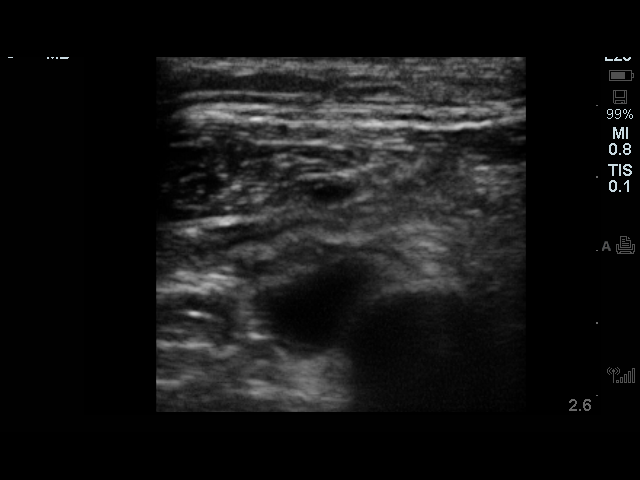

[4 of 4 positions shown; findings below may reference images not displayed]

EXAM:
IR RIGHT DECLOT; PTA VENOUS; ARTERIOVENOUS SHUNT FOR DIALYSIS;
ADDITIONAL ACCESS; IR ULTRASOUND GUIDANCE VASC ACCESS RIGHT; IR
RIGHT FLOURO GUIDE CV LINE

Date: 07/15/2014

PROCEDURE:
1. Ultrasound-guided antegrade puncture adjacent to the arterial
anastomosis
2. Ultrasound-guided retrograde puncture
3. Catheterization of the right subclavian vein with central and
pull-back venogram
4. Angioplasty of subclavian venous stenosis to 10 mm
5. Pharmacomechanical thrombolysis/thrombectomy declot procedure
6. Attempted catheterization of the arterial inflow with limited
arteriogram
7. Ultrasound-guided puncture of the right subclavian vein from a
supraclavicular approach
8. Placement of a 19 cm tip to cuff tunneled hemodialysis catheter

ANESTHESIA/SEDATION:
Moderate (conscious) sedation was used. 2 mg Versed, 100 mcg
Fentanyl were administered intravenously. The patient's vital signs
were monitored continuously by radiology nursing throughout the
procedure.

Sedation Time: 80 minutes

MEDICATIONS:
2 g Ancef and 2555 units heparin administered intravenously. Ancef
was administered within 1 hours of skin incision.

FLUOROSCOPY TIME:  15 minutes 30 seconds

CONTRAST:  30mL OMNIPAQUE IOHEXOL 300 MG/ML  SOLN
Maximal barrier sterile technique utilized including caps, mask,
sterile gowns, sterile gloves, large sterile drape, hand hygiene,
and Betadine skin prep.

The right upper extremity arteriovenous fistula was interrogated
with ultrasound. There is subocclusive clot throughout the main
stick zone. The vein proximal and distal to this is patent and
compressible. However, there is a very weak pulse suggesting
possible inflow disease versus high-grade central stenosis.

Local anesthesia was attained by infiltration with 1% lidocaine.
Under real-time sonographic guidance, the vein was punctured in
antegrade fashion several cm from the arterial anastomosis. A
transitional 5 French micro sheath was advanced over a micro wire
into the stick zone. 2 mg of tPA was then injected into the
thrombus. This was manually macerated by gentle massage while the
outflow vein was capped compressed with the ultrasound probe. 2555
units of heparin was injected intravenously.

The transitional micro sheath was then exchanged over a Bentson wire
for a working 7 French vascular sheath. A Kumpe the catheter was
advanced into the right subclavian vein and a central venogram was
performed. There appears to be a moderate to high-grade stenosis of
the subclavian vein as it passes under the clavicle. A pull-back
venogram was then performed demonstrating patency of the cephalic
arch and proximal cephalic vein.

A Rosen wire was next advanced into the superior vena cava and the
subclavian vein was angioplasty to 10 mm using a 10 x 40 mm Conquest
balloon. The inflation was maintained at 20 atmospheres with full
effacement for 2 minutes. Following angioplasty, the balloon was
removed. The 6 French Angiojet device was then inserted over the
Rosen wire and rheolytic thrombectomy/thrombolysis was performed
throughout the region of the fistula with internal thrombus. The
Angiojet was then removed and a 7 x 40 mm Conquest balloon was
advanced through the sheath an used to further balloon macerate and
angioplasty the stick zone and proximal draining vein.

Following this, aspiration was performed through the sheath. There
was blood flow however it was extremely week. Therefore, using
similar technique to above, ultrasound-guided puncture of the
draining vein in retrograde fashion directed toward the arterial
anastomosis was performed with a 21 gauge micropuncture needle. An
image was obtained and stored for the medical record. Using the
transitional micro sheath, a working 6 French vascular sheath was
advanced into the vein. A Kumpe the catheter and Glidewire were used
to advanced into the arterial anastomosis. A gentle hand injection
of contrast material demonstrates stasis within the inflow artery
secondary to the 5 French catheter being occlusive. This was
confirmed is the catheter was gently removed. Once back within the
main draining vein the contrast column slowly washed out into the
fistula. There is no distal flow which represents an interval change
compared to Tuesday February, 2013. Unfortunately, the inflow radial artery
is extremely atretic. There is no outflow distal to the stenosis.
The vessel measures approximately 2 mm in diameter.

Attempts were made to gently catheterize the very small radial
artery, however the 5 French catheter was occlusive and a wire could
not make the 90 degree bend. Given the very poor arterial inflow,
the chance of obtaining sustained patency of this fistula is near 0.
Therefore, further intervention was aborted.

Attention was turned to the right neck for placement of a tunneled
hemodialysis catheter.

The right neck was interrogated with ultrasound. The right internal
jugular vein is atretic and thick walled and cannot be visualized
directly to the subclavian vein. The subclavian vein and external
jugular vein are slightly hyper trophic. This suggests distal
occlusion of the right IJ. As the subclavian vein was easily visible
from a supraclavicular approach, it was decided to place the
catheter directly into this vessel. Local anesthesia was attained by
infiltration with 1% lidocaine. A small dermatotomy was made. Under
real-time sonographic guidance (an image was obtained and stored for
the medical record) the vessels punctured with a 21 gauge
micropuncture needle. With the assistance of a 5 French micro
sheath, a 0.018 inch wire was advanced into the heart in the
intravascular depth measured. The micro wire was then removed and a
0.035 inch wire was advanced through the heart and into the inferior
vena cava.

A suitable skin exit site inferior to the clavicle was selected.
Local anesthesia was attained by 1% lidocaine infiltration. A small
dermatotomy was made. A 19 cm tip to cuff Paats hemo split
hemodialysis catheter was then tunneled from the skin exit site to
the dermatotomy overlying the venous access site. A peel-away sheath
was then advanced over the Bentson wire and into the right heart.
The tunneled hemodialysis catheter was then fed through the
peel-away sheath and the tips positioned in the upper right atrium
under fluoroscopic imaging. An image was obtained and stored.

The catheter flushed and aspirated with ease and was locked with
heparinized saline. The catheter was then secured to the skin with 0
Prolene suture and a sterile bandage. The dermatotomy over the
venous access site was sealed with Dermabond. The patient tolerated
the procedure well.

COMPLICATIONS:
None
IMPRESSION: 1. Angioplasty of right subclavian venous stenosis to 10 mm.
2. Unsuccessful declot procedure secondary to very poor arterial
inflow from a small, atherosclerotic and atretic radial artery.
3. Successful placement of a right IJ approach 19 cm tip to cuff
tunneled hemodialysis catheter. Catheter tips are in the upper right
atrium and ready for immediate use.

PLAN:
1. Recommend referral to vascular surgery for revision of the right
upper extremity arteriovenous fistula.
